# Patient Record
Sex: Male | Born: 1955 | Race: Black or African American | Hispanic: No | Marital: Single | Smoking: Former smoker
Health system: Southern US, Community
[De-identification: ages and names within clinical notes are randomized; demographics above are authoritative.]

## PROBLEM LIST (undated history)

## (undated) DIAGNOSIS — T7840XA Allergy, unspecified, initial encounter: Secondary | ICD-10-CM

## (undated) DIAGNOSIS — M1990 Unspecified osteoarthritis, unspecified site: Secondary | ICD-10-CM

## (undated) DIAGNOSIS — G5602 Carpal tunnel syndrome, left upper limb: Secondary | ICD-10-CM

## (undated) DIAGNOSIS — F1910 Other psychoactive substance abuse, uncomplicated: Secondary | ICD-10-CM

## (undated) HISTORY — DX: Allergy, unspecified, initial encounter: T78.40XA

## (undated) HISTORY — PX: DENTAL SURGERY: SHX609

## (undated) HISTORY — PX: REPAIR ANKLE LIGAMENT: SUR1187

## (undated) HISTORY — PX: CATARACT EXTRACTION, BILATERAL: SHX1313

---

## 2012-04-07 ENCOUNTER — Encounter (HOSPITAL_COMMUNITY): Payer: Self-pay | Admitting: *Deleted

## 2012-04-07 ENCOUNTER — Emergency Department (HOSPITAL_COMMUNITY)
Admission: EM | Admit: 2012-04-07 | Discharge: 2012-04-07 | Disposition: A | Discharge status: Home / Self Care | Payer: Medicare Other | Attending: Emergency Medicine | Admitting: Emergency Medicine

## 2012-04-07 DIAGNOSIS — F1910 Other psychoactive substance abuse, uncomplicated: Secondary | ICD-10-CM | POA: Insufficient documentation

## 2012-04-07 DIAGNOSIS — Z8739 Personal history of other diseases of the musculoskeletal system and connective tissue: Secondary | ICD-10-CM | POA: Insufficient documentation

## 2012-04-07 HISTORY — DX: Other psychoactive substance abuse, uncomplicated: F19.10

## 2012-04-07 HISTORY — DX: Carpal tunnel syndrome, left upper limb: G56.02

## 2012-04-07 HISTORY — DX: Unspecified osteoarthritis, unspecified site: M19.90

## 2012-04-07 LAB — CBC
HCT: 42.4 % (ref 39.0–52.0)
RBC: 4.34 MIL/uL (ref 4.22–5.81)
RDW: 12.8 % (ref 11.5–15.5)
WBC: 4.4 10*3/uL (ref 4.0–10.5)

## 2012-04-07 LAB — COMPREHENSIVE METABOLIC PANEL
AST: 22 U/L (ref 0–37)
Albumin: 3.8 g/dL (ref 3.5–5.2)
Alkaline Phosphatase: 63 U/L (ref 39–117)
BUN: 15 mg/dL (ref 6–23)
CO2: 24 mEq/L (ref 19–32)
Chloride: 104 mEq/L (ref 96–112)
GFR calc non Af Amer: >90 mL/min (ref >90)
Potassium: 4.3 mEq/L (ref 3.5–5.1)
Total Bilirubin: 0.3 mg/dL (ref 0.3–1.2)

## 2012-04-07 LAB — URINALYSIS, ROUTINE W REFLEX MICROSCOPIC
Glucose, UA: NEGATIVE mg/dL
Hgb urine dipstick: NEGATIVE
Ketones, ur: NEGATIVE mg/dL
Protein, ur: NEGATIVE mg/dL

## 2012-04-07 LAB — RAPID URINE DRUG SCREEN, HOSP PERFORMED
Amphetamines: NOT DETECTED
Barbiturates: NOT DETECTED
Benzodiazepines: NOT DETECTED
Tetrahydrocannabinol: NOT DETECTED

## 2012-04-07 NOTE — ED Provider Notes (Addendum)
History     CSN: [id number]  Arrival date & time 04/07/12  0903   First MD Initiated Contact with Patient 04/07/12 1104      Chief Complaint  Patient presents with  . Medical Clearance    crack cocaine detox    (Consider location/radiation/quality/duration/timing/severity/associated sxs/prior treatment) The history is provided by the patient.   the patient is a 56 year old, male, with a history of cocaine abuse, who presents to emergency department stating he wants to get off crack cocaine and also that he may kill somebody who he is around her.  He says that in his neighborhood.  He is getting a lot of pressure from other people to join them using drugs and drinking alcohol and other illegal behaviors.  He says that he feels as if he is not able to call his anger.  He will kill them.  He denies suicidal thoughts.  His last cocaine use was 3 days ago.  He rarely drinks alcohol.  He has no somatic complaints at this time and has not been ill recently  Past Medical History  Diagnosis Date  . Substance abuse   . Arthritis   . Carpal tunnel syndrome, left     History reviewed. No pertinent past surgical history.  No family history on file.  History  Substance Use Topics  . Smoking status: Current Everyday Smoker -- 1.0 packs/day    Types: Cigarettes  . Smokeless tobacco: Not on file  . Alcohol Use: Yes     occa      Review of Systems  Constitutional: Negative for fever and chills.  Respiratory: Negative for cough.   Gastrointestinal: Negative for nausea, vomiting and diarrhea.  Musculoskeletal: Negative for back pain.  Neurological: Negative for headaches.  Psychiatric/Behavioral: Negative for suicidal ideas and confusion.       Homicidal thoughts  All other systems reviewed and are negative.    Allergies  Review of patient's allergies indicates no known allergies.  Home Medications   Current Outpatient Rx  Name Route Sig Dispense Refill  . IBUPROFEN 200 MG PO  TABS Oral Take 200 mg by mouth every 6 (six) hours as needed. pain      BP 143/72  Pulse 50  Temp(Src) 97.6 F (36.4 C) (Oral)  Resp 12  SpO2 100%  Physical Exam  Vitals reviewed. Constitutional: He is oriented to person, place, and time. He appears well-developed and well-nourished.  HENT:  Head: Normocephalic and atraumatic.  Eyes: Conjunctivae are normal.  Neck: Normal range of motion.  Cardiovascular: Regular rhythm.        Bradycardia  Pulmonary/Chest: Effort normal. No respiratory distress. He has no rales.  Abdominal: Soft. He exhibits no distension. There is no tenderness.  Musculoskeletal: Normal range of motion.  Neurological: He is alert and oriented to person, place, and time.  Skin: Skin is warm and dry.  Psychiatric: He has a normal mood and affect. Thought content normal.    ED Course  Procedures (including critical care time) 56 year old, male, who abuses cocaine, presents emergency department requesting help to stop using cocaine, but also homicidal thoughts towards people in his community.  He is not suicidal.  No evidence of psychosis or acute impairment with drugs or alcohol.  We will perform a medical screening examination and consult the act for further evaluation.  Labs Reviewed  COMPREHENSIVE METABOLIC PANEL - Abnormal; Notable for the following:    Glucose, Bld 59 (*)    All other components within normal limits  URINE RAPID DRUG SCREEN (HOSP PERFORMED) - Abnormal; Notable for the following:    Cocaine POSITIVE (*)    All other components within normal limits  CBC  URINALYSIS, ROUTINE W REFLEX MICROSCOPIC  ETHANOL   No results found.   No diagnosis found.  12:39 PM Spoke with act. She will see pt in ed.  Pt was seen by act.  He did not mention si/hi.  They have arranged outpt tx which he understands and agrees with  MDM  Cocaine abuse with homicidal thoughts        Cheri Guppy, MD 04/07/12 1240  Cheri Guppy,  MD 04/07/12 [phone]

## 2012-04-07 NOTE — ED Notes (Signed)
Report received-no s/s's of disress-oriented to room/unit rules-voicing no complaints at this time

## 2012-04-07 NOTE — Discharge Instructions (Signed)
Follow up as directed by the ACT team for treatment of substance abuse

## 2012-04-07 NOTE — ED Notes (Signed)
Pt requesting detox from crack cocaine, last use was Friday.  Pt reports that "people are trying to hold on to me and i want to get away from them."  Pt reports that he needs help.  Pt denies SI at this time.

## 2012-04-07 NOTE — BH Assessment (Signed)
Assessment Note   Christian Elliott is an 56 y.o. male.  Patient presented to the Emergency Department requesting assistance with substance abuse. Patient reports he last used on Friday and not currently having any withdrawal systems. During assessment, patient's thoughts were tangential and unorganized.  Patient expressed interest and desire to enter a rehabilitation program.  Explained to patient that we can provide him with resources and he will be able to follow up with a facility on his own since the last time he used was on Friday morning. Even thought pt is said to have reported homicidal thoughts to EDP, he currently denies SI/HI/AVH.     Axis I: Substance Abuse Axis II: Deferred Axis III:  Past Medical History  Diagnosis Date  . Substance abuse   . Arthritis   . Carpal tunnel syndrome, left    Axis IV: economic problems and other psychosocial or environmental problems Axis V: 51-60 moderate symptoms  Past Medical History:  Past Medical History  Diagnosis Date  . Substance abuse   . Arthritis   . Carpal tunnel syndrome, left     History reviewed. No pertinent past surgical history.  Family History: No family history on file.  Social History:  reports that he has been smoking Cigarettes.  He has been smoking about 1 pack per day. He does not have any smokeless tobacco history on file. He reports that he drinks alcohol. He reports that he uses illicit drugs (Cocaine).  Additional Social History:    Allergies: No Known Allergies  Home Medications:  No current facility-administered medications on file as of 04/07/2012.   No current outpatient prescriptions on file as of 04/07/2012.    OB/GYN Status:  No LMP for male patient.  General Assessment Data Location of Assessment: [facility] ED Living Arrangements: Other (Comment) Can pt return to current living arrangement?: Yes Admission Status: Voluntary Transfer from: Acute Hospital Referral Source: Self/Family/Friend  Education  Status Is patient currently in school?: No  Risk to self Suicidal Ideation: No Suicidal Intent: No Is patient at risk for suicide?: No Suicidal Plan?: No Access to Means: No Previous Attempts/Gestures: No Intentional Self Injurious Behavior: None Family Suicide History: Unknown Recent stressful life event(s): Financial Problems Persecutory voices/beliefs?: No Depression: Yes Depression Symptoms: Feeling worthless/self pity;Loss of interest in usual pleasures Substance abuse history and/or treatment for substance abuse?: Yes Suicide prevention information given to non-admitted patients: Not applicable  Risk to Others Homicidal Ideation: No Thoughts of Harm to Others: No Current Homicidal Intent: No Current Homicidal Plan: No Access to Homicidal Means: No History of harm to others?: No Assessment of Violence: None Noted Does patient have access to weapons?: No Criminal Charges Pending?: No Does patient have a court date: No  Psychosis Hallucinations: None noted Delusions: Unspecified  Mental Status Report Appear/Hygiene: Disheveled Eye Contact: Fair Motor Activity: Restlessness Speech: Tangential;Slurred;Incoherent Level of Consciousness: Alert Mood: Fearful Affect: Appropriate to circumstance Anxiety Level: None Thought Processes: Tangential;Circumstantial Judgement: Unimpaired Orientation: Person;Place;Time;Situation Obsessive Compulsive Thoughts/Behaviors: None  Cognitive Functioning Concentration: Normal Memory: Recent Intact;Remote Intact IQ: Average Insight: Fair Impulse Control: Fair Appetite: Good Sleep: No Change Vegetative Symptoms: None  Prior Inpatient Therapy Prior Inpatient Therapy: Yes Prior Therapy Dates: Various times prior to 2005 Prior Therapy Facilty/Provider(s): Various treatment facilities Reason for Treatment: Substance Abuse  Prior Outpatient Therapy Prior Outpatient Therapy: No                     Additional  Information 1:1 In Past 12 Months?: No  CIRT Risk: No Elopement Risk: No Does patient have medical clearance?: Yes     Disposition:  Disposition Disposition of Patient:  (Pending)    CSW recommends discharge with patient to follow up.    On Site Evaluation by:   Reviewed with Physician:     Marlaine Hind ANN S 04/07/2012 2:29 PM

## 2012-04-07 NOTE — ED Notes (Signed)
Pt attempted to give urine spec but was unable

## 2013-09-10 ENCOUNTER — Encounter (HOSPITAL_COMMUNITY): Payer: Self-pay | Admitting: *Deleted

## 2013-09-10 ENCOUNTER — Emergency Department (HOSPITAL_COMMUNITY)
Admission: EM | Admit: 2013-09-10 | Discharge: 2013-09-10 | Disposition: A | Discharge status: Home / Self Care | Payer: Medicaid Other | Attending: Emergency Medicine | Admitting: Emergency Medicine

## 2013-09-10 DIAGNOSIS — M2550 Pain in unspecified joint: Secondary | ICD-10-CM | POA: Insufficient documentation

## 2013-09-10 DIAGNOSIS — G5600 Carpal tunnel syndrome, unspecified upper limb: Secondary | ICD-10-CM | POA: Insufficient documentation

## 2013-09-10 DIAGNOSIS — M129 Arthropathy, unspecified: Secondary | ICD-10-CM | POA: Insufficient documentation

## 2013-09-10 DIAGNOSIS — F1410 Cocaine abuse, uncomplicated: Secondary | ICD-10-CM | POA: Insufficient documentation

## 2013-09-10 DIAGNOSIS — F1010 Alcohol abuse, uncomplicated: Secondary | ICD-10-CM | POA: Insufficient documentation

## 2013-09-10 DIAGNOSIS — R209 Unspecified disturbances of skin sensation: Secondary | ICD-10-CM | POA: Insufficient documentation

## 2013-09-10 DIAGNOSIS — F17200 Nicotine dependence, unspecified, uncomplicated: Secondary | ICD-10-CM | POA: Insufficient documentation

## 2013-09-10 DIAGNOSIS — E785 Hyperlipidemia, unspecified: Secondary | ICD-10-CM | POA: Insufficient documentation

## 2013-09-10 NOTE — ED Notes (Signed)
Pt reports pain to joints for extended amount of time. Having pain to entire body and also reports having cholesterol checked in past and it was high, does not take meds for it. No acute distress noted at triage.

## 2013-09-10 NOTE — ED Notes (Signed)
Wants "check up" regarding joint pain and cholesterol. Pt is from [place] and does not have a PCP in [place].

## 2013-09-10 NOTE — ED Provider Notes (Signed)
CSN: [id number]     Arrival date & time 09/10/13  1102 History  This chart was scribed for non-physician practitioner, Coral Ceo, PA, working with Shon Baton, MD, by [facility] ED Scribe. This patient was seen in room TR06C/TR06C and the patient's care was started at 1:46 PM.  Chief Complaint  Patient presents with  . Joint Pain  . Hyperlipidemia    The history is provided by the patient. No language interpreter was used.    HPI Comments: Christian Elliott is a 57 y.o. Male with a history of arthritis and carpal tunnel syndrome who presents to the Emergency Department complaining of intermittent, unchanged generalized joint pain for the past 5 years. He reports pain, described as "soreness", in his entire body. He states that this pain is worst in his back, neck and bilateral knees, wrists and hands. He denies recent injuries or trauma. He states that his pain is worsened when he is cold or when he is stationary for long periods of time. He states he has taken Ibuprofen with some relief. He states that he used to take Naprosyn for this pain on a regular basis, but that he no longer takes this medication. He states he has received injections with relief of his pain in the past. He also reports occasional tingling in his bilateral feet. He states that he has been told in the past that his cholesterol is high, but he states that he does not take medications for this. He states that he has been advised to make dietary changes to regulate his cholesterol. He denies weakness, numbness, redness or swelling in his joints, abdominal pain, nausea, emesis, constipation, diarrhea, chest pain, SOB, headache, light-headedness or any other symptoms. Pt is a current every day smoker of 1 pack/day and an occasional alcohol user. He also has a history of cocaine use.   Past Medical History  Diagnosis Date  . Substance abuse   . Arthritis   . Carpal tunnel syndrome, left    History reviewed. No  pertinent past surgical history. History reviewed. No pertinent family history. History  Substance Use Topics  . Smoking status: Current Every Day Smoker -- 1.00 packs/day    Types: Cigarettes  . Smokeless tobacco: Not on file  . Alcohol Use: Yes     Comment: occa    Review of Systems  Respiratory: Negative for shortness of breath.   Cardiovascular: Negative for chest pain.  Gastrointestinal: Negative for nausea, vomiting, abdominal pain, diarrhea and constipation.  Musculoskeletal: Positive for arthralgias.  Neurological: Negative for weakness, light-headedness, numbness and headaches.  All other systems reviewed and are negative.   Allergies  Review of patient's allergies indicates no known allergies.  Home Medications  No current outpatient prescriptions on file.  Triage Vitals: BP 117/70  Pulse 74  Temp(Src) 97.4 F (36.3 C) (Oral)  Ht 5\' 11"  (1.803 m)  Wt 152 lb (68.947 kg)  BMI 21.21 kg/m2  SpO2 98%  Filed Vitals:   09/10/13 1111  BP: 117/70  Pulse: 74  Temp: 97.4 F (36.3 C)  TempSrc: Oral  Height: 5\' 11"  (1.803 m)  Weight: 152 lb (68.947 kg)  SpO2: 98%     Physical Exam  Nursing note and vitals reviewed. Constitutional: He is oriented to person, place, and time. He appears well-developed and well-nourished. No distress.  HENT:  Head: Normocephalic and atraumatic.  Right Ear: External ear normal.  Left Ear: External ear normal.  Nose: Nose normal.  Mouth/Throat: Oropharynx is clear and  moist.  Eyes: Conjunctivae and EOM are normal. Pupils are equal, round, and reactive to light. Right eye exhibits no discharge. Left eye exhibits no discharge.  Neck: Normal range of motion. Neck supple. No tracheal deviation present.  No cervical spinal or paraspinal tenderness throughout  Cardiovascular: Normal rate, regular rhythm, normal heart sounds and intact distal pulses.  Exam reveals no gallop and no friction rub.   No murmur heard. Radial and dorsalis  pedis pulses present and equal bilaterally  Pulmonary/Chest: Effort normal and breath sounds normal. No respiratory distress. He has no wheezes. He has no rales. He exhibits no tenderness.  Abdominal: Soft. He exhibits no distension. There is no tenderness.  Musculoskeletal: Normal range of motion. He exhibits no edema and no tenderness.  Strength 5/5 throughout the upper and lower extremities bilaterally.  No tenderness to palpation to the upper and lower extremities throughout.  No limitations with knee flexion and extension, ankle circumduction, elbow flexion and extension, and shoulder circumduction.  Patient able to ambulate without difficulty or ataxia.   Neurological: He is alert and oriented to person, place, and time.  Gross sensation intact in the upper and lower extremities  Skin: Skin is warm and dry. He is not diaphoretic. No erythema.  No evidence of erythema, edema, ecchymosis, lacerations, or wounds to the joints or skin throughout.    Psychiatric: He has a normal mood and affect. His behavior is normal.    ED Course  Procedures (including critical care time)  DIAGNOSTIC STUDIES: Oxygen Saturation is 98% on RA, normal by my interpretation.    COORDINATION OF CARE:  Labs Review Labs Reviewed - No data to display Imaging Review No results found.  MDM   1. Arthralgia    Lamichael Elliott is a 57 y.o. Male with a history of arthritis and carpal tunnel syndrome who presents to the Emergency Department complaining of intermittent, unchanged generalized joint pain for the past 5 years.   Rechecks  1:54 PM- Pt was very hesitant to take Narcotics, because he believed they caused him to have hallucinations. Pt was hesitant to take Naprosyn because of unspecified reactions he has had in the past. Pt agrees with plan to take NSAID's to manage his pain and was given dosing instructions.    Etiology of arthralgias is likely due to arthritis.  Patient states he has had similar  unchanged pain for the past 5 years.  He denies any trauma or injury.  There are no signs of infection on exam including edema, erythema, or fever.  He is neurovascularly intact.  He states he has had evaluation in the past for his arthritis but is unsure what is causing his pain.  He also was told that he has high cholesterol 6 months ago but did not follow-up on this with his PCP. Patient was given dietary instructions on managing cholesterol and instructed to follow-up with his PCP for further management.  Patient was not prescribed anything for outpatient management.  He expressed concern for reactions to medications in the past and it was agreed that he could continue taking OTC medications.  Patient was instructed to return to the ED if they experience any joint edema/erytheama, fever, SOB, chest pain, weakness, loss of bowel/bladder function, or other concerns.  Patient was in agreement with discharge and plan.     Final impressions: 1. Arthralgias    Luiz Iron PA-C    I personally performed the services described in this documentation, which was scribed in my presence.  The recorded information has been reviewed and is accurate.     Jillyn Ledger, PA-C 09/12/13 1600

## 2013-09-13 NOTE — ED Provider Notes (Signed)
Medical screening examination/treatment/procedure(s) were performed by non-physician practitioner and as supervising physician I was immediately available for consultation/collaboration.  Shon Baton, MD 09/13/13 2030

## 2014-06-15 ENCOUNTER — Emergency Department (HOSPITAL_COMMUNITY)
Admission: EM | Admit: 2014-06-15 | Discharge: 2014-06-15 | Disposition: A | Discharge status: Home / Self Care | Payer: Medicare HMO | Attending: Emergency Medicine | Admitting: Emergency Medicine

## 2014-06-15 ENCOUNTER — Encounter (HOSPITAL_COMMUNITY): Payer: Self-pay | Admitting: Emergency Medicine

## 2014-06-15 DIAGNOSIS — F17200 Nicotine dependence, unspecified, uncomplicated: Secondary | ICD-10-CM | POA: Insufficient documentation

## 2014-06-15 DIAGNOSIS — Z8669 Personal history of other diseases of the nervous system and sense organs: Secondary | ICD-10-CM | POA: Diagnosis not present

## 2014-06-15 DIAGNOSIS — L988 Other specified disorders of the skin and subcutaneous tissue: Secondary | ICD-10-CM | POA: Diagnosis present

## 2014-06-15 DIAGNOSIS — Z8739 Personal history of other diseases of the musculoskeletal system and connective tissue: Secondary | ICD-10-CM | POA: Insufficient documentation

## 2014-06-15 DIAGNOSIS — Z79899 Other long term (current) drug therapy: Secondary | ICD-10-CM | POA: Insufficient documentation

## 2014-06-15 DIAGNOSIS — L089 Local infection of the skin and subcutaneous tissue, unspecified: Secondary | ICD-10-CM | POA: Diagnosis not present

## 2014-06-15 MED ORDER — CEPHALEXIN 500 MG PO CAPS: 500.0000 mg | ORAL_CAPSULE | Freq: Four times a day (QID) | ORAL | Status: DC

## 2014-06-15 NOTE — Discharge Instructions (Signed)
Take Keflex as directed until gone. Refer to attached documents for more information. Keep wound area clean.

## 2014-06-15 NOTE — ED Notes (Signed)
Hes had an itchy painful bump to L upper arm x 3 days

## 2014-06-15 NOTE — ED Provider Notes (Signed)
Medical screening examination/treatment/procedure(s) were performed by non-physician practitioner and as supervising physician I was immediately available for consultation/collaboration.   EKG Interpretation None       Martha K Linker, MD 06/15/14 1502 

## 2014-06-15 NOTE — ED Provider Notes (Signed)
CSN: [id number]     Arrival date & time 06/15/14  1251 History  This chart was scribed for non-physician practitioner, Alvina Chou, PA-C, working with Threasa Beards, MD, by Delphia Grates, ED Scribe. This patient was seen in room TR06C/TR06C and the patient's care was started at 1:59 PM.     Chief Complaint  Patient presents with  . Skin Problem     The history is provided by the patient. No language interpreter was used.    HPI Comments: Christian Elliott is a 58 y.o. male who presents to the Emergency Department complaining of a bump on upper left arm that appeared 3 days ago. Patient states he is unsure if he was bitten by an insect. There is associated itching. Patient has used Gold Bond lotion with no improvement.  He denies any pain. Patient has history of substance abuse and is a current 1 PPD smoker.  Past Medical History  Diagnosis Date  . Substance abuse   . Arthritis   . Carpal tunnel syndrome, left    History reviewed. No pertinent past surgical history. History reviewed. No pertinent family history. History  Substance Use Topics  . Smoking status: Current Every Day Smoker -- 1.00 packs/day    Types: Cigarettes  . Smokeless tobacco: Not on file  . Alcohol Use: No     Comment: occa    Review of Systems  Skin:       Bump on left upper arm  All other systems reviewed and are negative.     Allergies  Review of patient's allergies indicates no known allergies.  Home Medications   Prior to Admission medications   Medication Sig Start Date End Date Taking? Authorizing Provider  carboxymethylcellulose (REFRESH PLUS) 0.5 % SOLN Place 1 drop into both eyes 3 (three) times daily as needed (dry eyes).   Yes Historical Provider, MD  pravastatin (PRAVACHOL) 20 MG tablet Take 20 mg by mouth daily.   Yes Historical Provider, MD   Triage Vitals: BP 111/80  Pulse 83  Temp(Src) 97.9 F (36.6 C) (Oral)  Resp 20  SpO2 98%  Physical Exam  Nursing note and  vitals reviewed. Constitutional: He is oriented to person, place, and time. He appears well-developed and well-nourished. No distress.  HENT:  Head: Normocephalic and atraumatic.  Eyes: Conjunctivae and EOM are normal.  Neck: Neck supple. No tracheal deviation present.  Cardiovascular: Normal rate.   Pulmonary/Chest: Effort normal. No respiratory distress.  Musculoskeletal: Normal range of motion.  Neurological: He is alert and oriented to person, place, and time.  Skin: Skin is warm and dry.  2x2cm area of induration over the left bicep skin. There is a small puncture wound noted over the area of induration. No tenderness to palpation. No drainage noted.   Psychiatric: He has a normal mood and affect. His behavior is normal.    ED Course  Procedures (including critical care time)  DIAGNOSTIC STUDIES: Oxygen Saturation is 98% on room air, normal by my interpretation.    COORDINATION OF CARE: At 8657 Discussed treatment plan with patient which includes Keflex. Patient agrees.   Labs Review Labs Reviewed - No data to display  Imaging Review No results found.   EKG Interpretation None      MDM   Final diagnoses:  Skin infection    2:07 PM Patient likely has a skin infection and will be treated with keflex. Vitals stable and patient afebrile. Patient instructed to return with worsening or concerning symptoms.  I personally performed the services described in this documentation, which was scribed in my presence. The recorded information has been reviewed and is accurate.    Alvina Chou, PA-C 06/15/14 1407

## 2014-08-14 ENCOUNTER — Encounter (HOSPITAL_COMMUNITY): Payer: Self-pay | Admitting: Emergency Medicine

## 2014-08-14 ENCOUNTER — Emergency Department (HOSPITAL_COMMUNITY)
Admission: EM | Admit: 2014-08-14 | Discharge: 2014-08-14 | Disposition: A | Discharge status: Home / Self Care | Payer: Medicare HMO | Attending: Emergency Medicine | Admitting: Emergency Medicine

## 2014-08-14 DIAGNOSIS — Z79899 Other long term (current) drug therapy: Secondary | ICD-10-CM | POA: Insufficient documentation

## 2014-08-14 DIAGNOSIS — Z8739 Personal history of other diseases of the musculoskeletal system and connective tissue: Secondary | ICD-10-CM | POA: Diagnosis not present

## 2014-08-14 DIAGNOSIS — IMO0002 Reserved for concepts with insufficient information to code with codable children: Secondary | ICD-10-CM | POA: Diagnosis not present

## 2014-08-14 DIAGNOSIS — Z8669 Personal history of other diseases of the nervous system and sense organs: Secondary | ICD-10-CM | POA: Insufficient documentation

## 2014-08-14 DIAGNOSIS — F17200 Nicotine dependence, unspecified, uncomplicated: Secondary | ICD-10-CM | POA: Insufficient documentation

## 2014-08-14 DIAGNOSIS — R110 Nausea: Secondary | ICD-10-CM | POA: Diagnosis not present

## 2014-08-14 DIAGNOSIS — L02419 Cutaneous abscess of limb, unspecified: Secondary | ICD-10-CM

## 2014-08-14 MED ORDER — HYDROCODONE-ACETAMINOPHEN 5-325 MG PO TABS
2.0000 | ORAL_TABLET | ORAL | Status: DC | PRN
Start: 2014-08-14 — End: 2016-03-11

## 2014-08-14 MED ORDER — SULFAMETHOXAZOLE-TRIMETHOPRIM 800-160 MG PO TABS: 1.0000 | ORAL_TABLET | Freq: Two times a day (BID) | ORAL | Status: DC

## 2014-08-14 NOTE — Discharge Instructions (Signed)
Warm compresses 20 minutes 4 times a day.   You may use topical benadryl cream for itching Abscess An abscess is an infected area that contains a collection of pus and debris.It can occur in almost any part of the body. An abscess is also known as a furuncle or boil. CAUSES  An abscess occurs when tissue gets infected. This can occur from blockage of oil or sweat glands, infection of hair follicles, or a minor injury to the skin. As the body tries to fight the infection, pus collects in the area and creates pressure under the skin. This pressure causes pain. People with weakened immune systems have difficulty fighting infections and get certain abscesses more often.  SYMPTOMS Usually an abscess develops on the skin and becomes a painful mass that is red, warm, and tender. If the abscess forms under the skin, you may feel a moveable soft area under the skin. Some abscesses break open (rupture) on their own, but most will continue to get worse without care. The infection can spread deeper into the body and eventually into the bloodstream, causing you to feel ill.  DIAGNOSIS  Your caregiver will take your medical history and perform a physical exam. A sample of fluid may also be taken from the abscess to determine what is causing your infection. TREATMENT  Your caregiver may prescribe antibiotic medicines to fight the infection. However, taking antibiotics alone usually does not cure an abscess. Your caregiver may need to make a small cut (incision) in the abscess to drain the pus. In some cases, gauze is packed into the abscess to reduce pain and to continue draining the area. HOME CARE INSTRUCTIONS   Only take over-the-counter or prescription medicines for pain, discomfort, or fever as directed by your caregiver.  If you were prescribed antibiotics, take them as directed. Finish them even if you start to feel better.  If gauze is used, follow your caregiver's directions for changing the gauze.  To  avoid spreading the infection:  Keep your draining abscess covered with a bandage.  Wash your hands well.  Do not share personal care items, towels, or whirlpools with others.  Avoid skin contact with others.  Keep your skin and clothes clean around the abscess.  Keep all follow-up appointments as directed by your caregiver. SEEK MEDICAL CARE IF:   You have increased pain, swelling, redness, fluid drainage, or bleeding.  You have muscle aches, chills, or a general ill feeling.  You have a fever. MAKE SURE YOU:   Understand these instructions.  Will watch your condition.  Will get help right away if you are not doing well or get worse. Document Released: 09/19/2005 Document Revised: 06/10/2012 Document Reviewed: 02/22/2012 [facility] [facility] [employer] Patient Information 2015 [place], [place]. This information is not intended to replace advice given to you by your health care provider. Make sure you discuss any questions you have with your health care provider.

## 2014-08-14 NOTE — ED Provider Notes (Signed)
CSN: [id number]     Arrival date & time 08/14/14  1127 History   First MD Initiated Contact with Patient 08/14/14 1200     Chief Complaint  Patient presents with  . Abscess     (Consider location/radiation/quality/duration/timing/severity/associated sxs/prior Treatment) Patient is a 58 y.o. male presenting with abscess. The history is provided by the patient. No language interpreter was used.  Abscess Location:  Shoulder/arm Shoulder/arm abscess location:  R axilla Size:  1 Abscess quality: draining, itching and redness   Red streaking: no   Progression:  Worsening Chronicity:  New Relieved by:  Nothing Worsened by:  Nothing tried Ineffective treatments:  None tried Associated symptoms: nausea     Past Medical History  Diagnosis Date  . Substance abuse   . Arthritis   . Carpal tunnel syndrome, left    No past surgical history on file. No family history on file. History  Substance Use Topics  . Smoking status: Current Every Day Smoker -- 1.00 packs/day    Types: Cigarettes  . Smokeless tobacco: Not on file  . Alcohol Use: No     Comment: occa    Review of Systems  Gastrointestinal: Positive for nausea.  All other systems reviewed and are negative.     Allergies  Review of patient's allergies indicates no known allergies.  Home Medications   Prior to Admission medications   Medication Sig Start Date End Date Taking? Authorizing Provider  pravastatin (PRAVACHOL) 20 MG tablet Take 20 mg by mouth daily.   Yes Historical Provider, MD   BP 117/79  Pulse 77  Temp(Src) 98 F (36.7 C) (Oral)  Resp 16  SpO2 100% Physical Exam  Nursing note and vitals reviewed. Constitutional: He appears well-developed and well-nourished.  HENT:  Head: Normocephalic.  Eyes: Pupils are equal, round, and reactive to light.  Neck: Normal range of motion.  Cardiovascular: Normal rate.   Pulmonary/Chest: Effort normal.  Abdominal: Soft.  Musculoskeletal: He exhibits  tenderness.  1cm swollen area under right arm,  No erythema  Neurological: He is alert.  Skin: Skin is warm.  Psychiatric: He has a normal mood and affect.    ED Course  Procedures (including critical care time) Labs Review Labs Reviewed - No data to display  Imaging Review No results found.   EKG Interpretation None      MDM   Final diagnoses:  Abscess, axilla    Bactrim Hydrocodone Benadryl Return if any problems.    [place], PA-C 08/14/14 [place], [place] 08/14/14 1245

## 2014-08-14 NOTE — ED Notes (Signed)
Pt c/o abscess to under rt arm x 3 days.

## 2014-08-15 NOTE — ED Provider Notes (Signed)
Medical screening examination/treatment/procedure(s) were performed by non-physician practitioner and as supervising physician I was immediately available for consultation/collaboration.   EKG Interpretation None        Pamella Pert, MD 08/15/14 [phone]

## 2014-11-06 ENCOUNTER — Encounter (HOSPITAL_COMMUNITY): Payer: Self-pay | Admitting: Emergency Medicine

## 2014-11-06 ENCOUNTER — Emergency Department (HOSPITAL_COMMUNITY)
Admission: EM | Admit: 2014-11-06 | Discharge: 2014-11-06 | Disposition: A | Discharge status: Home / Self Care | Payer: Medicare HMO | Attending: Emergency Medicine | Admitting: Emergency Medicine

## 2014-11-06 DIAGNOSIS — M79662 Pain in left lower leg: Secondary | ICD-10-CM | POA: Diagnosis not present

## 2014-11-06 DIAGNOSIS — B353 Tinea pedis: Secondary | ICD-10-CM

## 2014-11-06 DIAGNOSIS — Z79899 Other long term (current) drug therapy: Secondary | ICD-10-CM | POA: Insufficient documentation

## 2014-11-06 DIAGNOSIS — Z720 Tobacco use: Secondary | ICD-10-CM | POA: Diagnosis not present

## 2014-11-06 DIAGNOSIS — M1990 Unspecified osteoarthritis, unspecified site: Secondary | ICD-10-CM | POA: Diagnosis not present

## 2014-11-06 DIAGNOSIS — L02415 Cutaneous abscess of right lower limb: Secondary | ICD-10-CM | POA: Diagnosis present

## 2014-11-06 DIAGNOSIS — M79605 Pain in left leg: Secondary | ICD-10-CM

## 2014-11-06 MED ORDER — CLOTRIMAZOLE 1 % EX CREA: 1.0000 "application " | TOPICAL_CREAM | Freq: Two times a day (BID) | CUTANEOUS | Status: DC

## 2014-11-06 MED ORDER — DIPHENHYDRAMINE HCL 25 MG PO TABS: 25.0000 mg | ORAL_TABLET | Freq: Four times a day (QID) | ORAL | Status: DC | PRN

## 2014-11-06 MED ORDER — SULFAMETHOXAZOLE-TRIMETHOPRIM 800-160 MG PO TABS: 1.0000 | ORAL_TABLET | Freq: Two times a day (BID) | ORAL | Status: DC

## 2014-11-06 NOTE — ED Provider Notes (Signed)
CSN: [id number]     Arrival date & time 11/06/14  1534 History  This chart was scribed for Cherylann Parr, PA-C, working with Artis Delay, MD found by Starleen Arms, ED Scribe. This patient was seen in room WTR6/WTR6 and the patient's care was started at 5:00 PM.   Chief Complaint  Patient presents with  . Abscess    pustule on r/lower leg  . Insect Bite    sore, itching red patch on both leower legs  . Cellulitis    lower legs   The history is provided by the patient. No language interpreter was used.   HPI Comments: Christian Elliott is a 58 y.o. male who presents to the Emergency Department complaining of 2 gradually worsening areas of redness, swelling, drainage, and warmth on his left shin below the knee and mid-right shin that he first noticed this morning.  He reports that the affected area on the left shin is itchy.  Patient reports that these may be insect bites but he is unsure.    Past Medical History  Diagnosis Date  . Substance abuse   . Arthritis   . Carpal tunnel syndrome, left    Past Surgical History  Procedure Laterality Date  . Dental surgery     Family History  Problem Relation Age of Onset  . Diabetes Sister   . Diabetes Other    History  Substance Use Topics  . Smoking status: Current Every Day Smoker -- 1.00 packs/day    Types: Cigarettes  . Smokeless tobacco: Not on file  . Alcohol Use: Yes     Comment: occa    Review of Systems  Constitutional: Negative for fever.  Skin: Positive for color change and wound.  All other systems reviewed and are negative.     Allergies  Review of patient's allergies indicates no known allergies.  Home Medications   Prior to Admission medications   Medication Sig Start Date End Date Taking? Authorizing Provider  clotrimazole (LOTRIMIN) 1 % cream Apply 1 application topically 2 (two) times daily. 11/06/14   Matison Nuccio A Forcucci, PA-C  diphenhydrAMINE (BENADRYL) 25 MG tablet Take 1 tablet (25 mg total) by  mouth every 6 (six) hours as needed for itching. 11/06/14   Aloria Looper A Forcucci, PA-C  HYDROcodone-acetaminophen (NORCO/VICODIN) 5-325 MG per tablet Take 2 tablets by mouth every 4 (four) hours as needed. 08/14/14   Fransico Meadow, PA-C  pravastatin (PRAVACHOL) 20 MG tablet Take 20 mg by mouth daily.    Historical Provider, MD  sulfamethoxazole-trimethoprim (SEPTRA DS) 800-160 MG per tablet Take 1 tablet by mouth 2 (two) times daily. 11/06/14   [place] Kirchner A Forcucci, PA-C   BP 105/75 mmHg  Pulse 89  Temp(Src) 98.1 F (36.7 C) (Oral)  Resp 18  Wt 159 lb (72.122 kg)  SpO2 100% Physical Exam  Constitutional: He is oriented to person, place, and time. He appears well-developed and well-nourished. No distress.  HENT:  Head: Normocephalic and atraumatic.  Eyes: Conjunctivae and EOM are normal.  Neck: Neck supple. No tracheal deviation present.  Cardiovascular: Normal rate.   Pulmonary/Chest: Effort normal. No respiratory distress.  Musculoskeletal: Normal range of motion.  Neurological: He is alert and oriented to person, place, and time.  Skin: Skin is warm and dry.  Left leg has 2 cm x 1 cm erythematous warm nodule to the left anterior tibia below the anterior tibial tuberosity.   Right leg has actively draining superficial abscess that is 1 cm x 1 cm  Bilateral peeling of skin and moisture located between toes on the right and left foot with mild erythema.    Psychiatric: He has a normal mood and affect. His behavior is normal.  Nursing note and vitals reviewed.   ED Course  Procedures (including critical care time)  DIAGNOSTIC STUDIES: Oxygen Saturation is 100% on RA, normal by my interpretation.    COORDINATION OF CARE:  5:10 PM Will prescribe bactrim and benadryl for itching.  Will prescribe antibiotic.  Advised patient to use warm compress on area.  Advised patient of return precautions including red streaking, nausea/vomiting, and increasing size of the area of  complaint.  Labs Review Labs Reviewed - No data to display  Imaging Review No results found.   EKG Interpretation None      MDM   Final diagnoses:  Abscess of right leg  Left leg pain  Tinea pedis of both feet   Patient is a 58 y.o. Male with multiple skin complaints.  Physical exam reveals an actively draining superficial abscess on the right leg, an erythematous warm nodule on the left leg, and athletes foot.  Left leg is early abscess vs. Cellulitis vs. Localized insect bite reaction.  Will cover with oral benadryl, bactrim, and will give lotromin for athletes foot.  Patient to return for worsening signs of infection.  Patient to use warm compresses.  Patient states understanding and agreement.   I personally performed the services described in this documentation, which was scribed in my presence. The recorded information has been reviewed and is accurate.   Cherylann Parr, PA-C 11/06/14 1720  Artis Delay, MD 11/06/14 [phone]

## 2014-11-06 NOTE — ED Notes (Signed)
Pt reports that he noticed the itching, red patches on front of both lower legs. Small raised pustule on r/lower leg. Treated this am with Benadryl cream, no change in discormfort

## 2014-11-06 NOTE — Discharge Instructions (Signed)
Abscess An abscess is an infected area that contains a collection of pus and debris.It can occur in almost any part of the body. An abscess is also known as a furuncle or boil. CAUSES  An abscess occurs when tissue gets infected. This can occur from blockage of oil or sweat glands, infection of hair follicles, or a minor injury to the skin. As the body tries to fight the infection, pus collects in the area and creates pressure under the skin. This pressure causes pain. People with weakened immune systems have difficulty fighting infections and get certain abscesses more often.  SYMPTOMS Usually an abscess develops on the skin and becomes a painful mass that is red, warm, and tender. If the abscess forms under the skin, you may feel a moveable soft area under the skin. Some abscesses break open (rupture) on their own, but most will continue to get worse without care. The infection can spread deeper into the body and eventually into the bloodstream, causing you to feel ill.  DIAGNOSIS  Your caregiver will take your medical history and perform a physical exam. A sample of fluid may also be taken from the abscess to determine what is causing your infection. TREATMENT  Your caregiver may prescribe antibiotic medicines to fight the infection. However, taking antibiotics alone usually does not cure an abscess. Your caregiver may need to make a small cut (incision) in the abscess to drain the pus. In some cases, gauze is packed into the abscess to reduce pain and to continue draining the area. HOME CARE INSTRUCTIONS   Only take over-the-counter or prescription medicines for pain, discomfort, or fever as directed by your caregiver.  If you were prescribed antibiotics, take them as directed. Finish them even if you start to feel better.  If gauze is used, follow your caregiver's directions for changing the gauze.  To avoid spreading the infection:  Keep your draining abscess covered with a  bandage.  Wash your hands well.  Do not share personal care items, towels, or whirlpools with others.  Avoid skin contact with others.  Keep your skin and clothes clean around the abscess.  Keep all follow-up appointments as directed by your caregiver. SEEK MEDICAL CARE IF:   You have increased pain, swelling, redness, fluid drainage, or bleeding.  You have muscle aches, chills, or a general ill feeling.  You have a fever. MAKE SURE YOU:   Understand these instructions.  Will watch your condition.  Will get help right away if you are not doing well or get worse. Document Released: 09/19/2005 Document Revised: 06/10/2012 Document Reviewed: 02/22/2012 [facility] Patient Information 2015 [place], [place]. This information is not intended to replace advice given to you by your health care provider. Make sure you discuss any questions you have with your health care provider.  Athlete's Foot Athlete's foot (tinea pedis) is a fungal infection of the skin on the feet. It often occurs on the skin between the toes or underneath the toes. It can also occur on the soles of the feet. Athlete's foot is more likely to occur in hot, humid weather. Not washing your feet or changing your socks often enough can contribute to athlete's foot. The infection can spread from person to person (contagious). CAUSES Athlete's foot is caused by a fungus. This fungus thrives in warm, moist places. Most people get athlete's foot by sharing shower stalls, towels, and wet floors with an infected person. People with weakened immune systems, including those with diabetes, may be more likely  to get athlete's foot. SYMPTOMS   Itchy areas between the toes or on the soles of the feet.  White, flaky, or scaly areas between the toes or on the soles of the feet.  Tiny, intensely itchy blisters between the toes or on the soles of the feet.  Tiny cuts on the skin. These cuts can develop a bacterial infection.  Thick or  discolored toenails. DIAGNOSIS  Your caregiver can usually tell what the problem is by doing a physical exam. Your caregiver may also take a skin sample from the rash area. The skin sample may be examined under a microscope, or it may be tested to see if fungus will grow in the sample. A sample may also be taken from your toenail for testing. TREATMENT  Over-the-counter and prescription medicines can be used to kill the fungus. These medicines are available as powders or creams. Your caregiver can suggest medicines for you. Fungal infections respond slowly to treatment. You may need to continue using your medicine for several weeks. PREVENTION   Do not share towels.  Wear sandals in wet areas, such as shared locker rooms and shared showers.  Keep your feet dry. Wear shoes that allow air to circulate. Wear cotton or wool socks. HOME CARE INSTRUCTIONS   Take medicines as directed by your caregiver. Do not use steroid creams on athlete's foot.  Keep your feet clean and cool. Wash your feet daily and dry them thoroughly, especially between your toes.  Change your socks every day. Wear cotton or wool socks. In hot climates, you may need to change your socks 2 to 3 times per day.  Wear sandals or canvas tennis shoes with good air circulation.  If you have blisters, soak your feet in Burow's solution or Epsom salts for 20 to 30 minutes, 2 times a day to dry out the blisters. Make sure you dry your feet thoroughly afterward. SEEK MEDICAL CARE IF:   You have a fever.  You have swelling, soreness, warmth, or redness in your foot.  You are not getting better after 7 days of treatment.  You are not completely cured after 30 days.  You have any problems caused by your medicines. MAKE SURE YOU:   Understand these instructions.  Will watch your condition.  Will get help right away if you are not doing well or get worse. Document Released: 12/07/2000 Document Revised: 03/03/2012 Document  Reviewed: 09/28/2011 [facility] Patient Information 2015 [place], [place]. This information is not intended to replace advice given to you by your health care provider. Make sure you discuss any questions you have with your health care provider.

## 2014-11-09 ENCOUNTER — Emergency Department (HOSPITAL_COMMUNITY)
Admission: EM | Admit: 2014-11-09 | Discharge: 2014-11-09 | Disposition: A | Discharge status: Home / Self Care | Payer: Medicare HMO | Attending: Emergency Medicine | Admitting: Emergency Medicine

## 2014-11-09 ENCOUNTER — Encounter (HOSPITAL_COMMUNITY): Payer: Self-pay

## 2014-11-09 DIAGNOSIS — L02416 Cutaneous abscess of left lower limb: Secondary | ICD-10-CM | POA: Diagnosis not present

## 2014-11-09 DIAGNOSIS — L02415 Cutaneous abscess of right lower limb: Secondary | ICD-10-CM | POA: Diagnosis not present

## 2014-11-09 DIAGNOSIS — Z720 Tobacco use: Secondary | ICD-10-CM | POA: Insufficient documentation

## 2014-11-09 DIAGNOSIS — Z79899 Other long term (current) drug therapy: Secondary | ICD-10-CM | POA: Diagnosis not present

## 2014-11-09 DIAGNOSIS — M1990 Unspecified osteoarthritis, unspecified site: Secondary | ICD-10-CM | POA: Diagnosis not present

## 2014-11-09 DIAGNOSIS — Z792 Long term (current) use of antibiotics: Secondary | ICD-10-CM | POA: Diagnosis not present

## 2014-11-09 DIAGNOSIS — Z8669 Personal history of other diseases of the nervous system and sense organs: Secondary | ICD-10-CM | POA: Diagnosis not present

## 2014-11-09 MED ORDER — LIDOCAINE-EPINEPHRINE 2 %-1:100000 IJ SOLN
20.0000 mL | Freq: Once | INTRAMUSCULAR | Status: AC
  Administered 2014-11-09: 20 mL

## 2014-11-09 MED ORDER — CLINDAMYCIN HCL 300 MG PO CAPS: 300.0000 mg | ORAL_CAPSULE | Freq: Four times a day (QID) | ORAL | Status: DC

## 2014-11-09 NOTE — Discharge Instructions (Signed)
STOP taking bactrim and START taking clindamycin. Follow up with your primary care doctor. Apply warm soaks throughout the day.  Abscess An abscess is an infected area that contains a collection of pus and debris.It can occur in almost any part of the body. An abscess is also known as a furuncle or boil. CAUSES  An abscess occurs when tissue gets infected. This can occur from blockage of oil or sweat glands, infection of hair follicles, or a minor injury to the skin. As the body tries to fight the infection, pus collects in the area and creates pressure under the skin. This pressure causes pain. People with weakened immune systems have difficulty fighting infections and get certain abscesses more often.  SYMPTOMS Usually an abscess develops on the skin and becomes a painful mass that is red, warm, and tender. If the abscess forms under the skin, you may feel a moveable soft area under the skin. Some abscesses break open (rupture) on their own, but most will continue to get worse without care. The infection can spread deeper into the body and eventually into the bloodstream, causing you to feel ill.  DIAGNOSIS  Your caregiver will take your medical history and perform a physical exam. A sample of fluid may also be taken from the abscess to determine what is causing your infection. TREATMENT  Your caregiver may prescribe antibiotic medicines to fight the infection. However, taking antibiotics alone usually does not cure an abscess. Your caregiver may need to make a small cut (incision) in the abscess to drain the pus. In some cases, gauze is packed into the abscess to reduce pain and to continue draining the area. HOME CARE INSTRUCTIONS   Only take over-the-counter or prescription medicines for pain, discomfort, or fever as directed by your caregiver.  If you were prescribed antibiotics, take them as directed. Finish them even if you start to feel better.  If gauze is used, follow your caregiver's  directions for changing the gauze.  To avoid spreading the infection:  Keep your draining abscess covered with a bandage.  Wash your hands well.  Do not share personal care items, towels, or whirlpools with others.  Avoid skin contact with others.  Keep your skin and clothes clean around the abscess.  Keep all follow-up appointments as directed by your caregiver. SEEK MEDICAL CARE IF:   You have increased pain, swelling, redness, fluid drainage, or bleeding.  You have muscle aches, chills, or a general ill feeling.  You have a fever. MAKE SURE YOU:   Understand these instructions.  Will watch your condition.  Will get help right away if you are not doing well or get worse. Document Released: 09/19/2005 Document Revised: 06/10/2012 Document Reviewed: 02/22/2012 [facility] Patient Information 2015 [place], [place]. This information is not intended to replace advice given to you by your health care provider. Make sure you discuss any questions you have with your health care provider.  Cellulitis Cellulitis is an infection of the skin and the tissue beneath it. The infected area is usually red and tender. Cellulitis occurs most often in the arms and lower legs.  CAUSES  Cellulitis is caused by bacteria that enter the skin through cracks or cuts in the skin. The most common types of bacteria that cause cellulitis are staphylococci and streptococci. SIGNS AND SYMPTOMS   Redness and warmth.  Swelling.  Tenderness or pain.  Fever. DIAGNOSIS  Your health care provider can usually determine what is wrong based on a physical exam. Blood tests  may also be done. TREATMENT  Treatment usually involves taking an antibiotic medicine. HOME CARE INSTRUCTIONS   Take your antibiotic medicine as directed by your health care provider. Finish the antibiotic even if you start to feel better.  Keep the infected arm or leg elevated to reduce swelling.  Apply a warm cloth to the affected area  up to 4 times per day to relieve pain.  Take medicines only as directed by your health care provider.  Keep all follow-up visits as directed by your health care provider. SEEK MEDICAL CARE IF:   You notice red streaks coming from the infected area.  Your red area gets larger or turns dark in color.  Your bone or joint underneath the infected area becomes painful after the skin has healed.  Your infection returns in the same area or another area.  You notice a swollen bump in the infected area.  You develop new symptoms.  You have a fever. SEEK IMMEDIATE MEDICAL CARE IF:   You feel very sleepy.  You develop vomiting or diarrhea.  You have a general ill feeling (malaise) with muscle aches and pains. MAKE SURE YOU:   Understand these instructions.  Will watch your condition.  Will get help right away if you are not doing well or get worse. Document Released: 09/19/2005 Document Revised: 04/26/2014 Document Reviewed: 02/25/2012 [facility] Patient Information 2015 [place], [place]. This information is not intended to replace advice given to you by your health care provider. Make sure you discuss any questions you have with your health care provider.

## 2014-11-09 NOTE — ED Provider Notes (Signed)
CSN: [id number]     Arrival date & time 11/09/14  1302 History  This chart was scribed for non-physician practitioner, Lucien Mons, PA-C working with Veryl Speak, MD by Evelene Croon, ED Scribe. This patient was seen in room WTR7/WTR7 and the patient's care was started at 2:42 PM.    Chief Complaint  Patient presents with  . Abscess     The history is provided by the patient. No language interpreter was used.     HPI Comments:  Christian Elliott is a 58 y.o. male who presents to the Emergency Department complaining of a lesion to his LLE and smaller lesion on his RLE. He was evaluated three days ago for the same and given benadryl, bactrim and clotrimazol which he has been using with no improvement. He reports itching around lesions and increased redness to the site.  He has also tried hot compresses without relief. He denies fever and drainage.   Past Medical History  Diagnosis Date  . Substance abuse   . Arthritis   . Carpal tunnel syndrome, left    Past Surgical History  Procedure Laterality Date  . Dental surgery     Family History  Problem Relation Age of Onset  . Diabetes Sister   . Diabetes Other    History  Substance Use Topics  . Smoking status: Current Every Day Smoker -- 1.00 packs/day    Types: Cigarettes  . Smokeless tobacco: Not on file  . Alcohol Use: Yes     Comment: occa    Review of Systems  Skin: Positive for wound.  All other systems reviewed and are negative.     Allergies  Review of patient's allergies indicates no known allergies.  Home Medications   Prior to Admission medications   Medication Sig Start Date End Date Taking? Authorizing Provider  clindamycin (CLEOCIN) 300 MG capsule Take 1 capsule (300 mg total) by mouth 4 (four) times daily. X 7 days 11/09/14   Carman Ching, PA-C  clotrimazole (LOTRIMIN) 1 % cream Apply 1 application topically 2 (two) times daily. 11/06/14   Courtney A Forcucci, PA-C  diphenhydrAMINE (BENADRYL) 25 MG  tablet Take 1 tablet (25 mg total) by mouth every 6 (six) hours as needed for itching. 11/06/14   Courtney A Forcucci, PA-C  HYDROcodone-acetaminophen (NORCO/VICODIN) 5-325 MG per tablet Take 2 tablets by mouth every 4 (four) hours as needed. 08/14/14   Fransico Meadow, PA-C  pravastatin (PRAVACHOL) 20 MG tablet Take 20 mg by mouth daily.    Historical Provider, MD  sulfamethoxazole-trimethoprim (SEPTRA DS) 800-160 MG per tablet Take 1 tablet by mouth 2 (two) times daily. 11/06/14   Courtney A Forcucci, PA-C   BP 119/73 mmHg  Pulse 78  Temp(Src) 98.5 F (36.9 C) (Oral)  Resp 16  SpO2 98% Physical Exam  Constitutional: He is oriented to person, place, and time. He appears well-developed and well-nourished. No distress.  HENT:  Head: Normocephalic and atraumatic.  Eyes: Conjunctivae and EOM are normal.  Neck: Normal range of motion. Neck supple.  Cardiovascular: Normal rate, regular rhythm and normal heart sounds.   Pulmonary/Chest: Effort normal and breath sounds normal.  Musculoskeletal: Normal range of motion. He exhibits no edema.       Legs: Neurological: He is alert and oriented to person, place, and time.  Skin: Skin is warm and dry.  Psychiatric: He has a normal mood and affect. His behavior is normal.  Nursing note and vitals reviewed.   ED Course  Procedures  INCISION AND DRAINAGE Performed by: Kathrynn Running, PA student Consent: Verbal consent obtained. Risks and benefits: risks, benefits and alternatives were discussed Type: abscess  Body area: right leg  Anesthesia: local infiltration  Incision was made with a scalpel.  Local anesthetic: lidocaine 2% with epinephrine  Anesthetic total: 2 ml  Complexity: complex Blunt dissection to break up loculations  Drainage: purulent  Drainage amount: large  Packing material: none  Patient tolerance: Patient tolerated the procedure well with no immediate complications.   INCISION AND  DRAINAGE Performed by: Kathrynn Running, PA student Consent: Verbal consent obtained. Risks and benefits: risks, benefits and alternatives were discussed Type: abscess  Body area: left leg  Anesthesia: local infiltration  Incision was made with a scalpel.  Local anesthetic: lidocaine 2% with epinephrine  Anesthetic total: 2.5 ml  Complexity: complex Blunt dissection to break up loculations  Drainage: purulent  Drainage amount: large  Packing material: none  Patient tolerance: Patient tolerated the procedure well with no immediate complications.     DIAGNOSTIC STUDIES:  Oxygen Saturation is  98% on RA, normal by my interpretation.    COORDINATION OF CARE:  2:45 PM Discussed treatment plan with pt which includes I&D and pt agreed to plan.  Labs Review Labs Reviewed - No data to display  Imaging Review No results found.   EKG Interpretation None      MDM   Final diagnoses:  Abscess of left leg  Abscess of right leg    Patient in no apparent distress. Afebrile, vital signs stable. Areas examined at his last visit are now drainable. Both abscesses drained with purulent fluid. Given these have gotten worse, patient being on Bactrim, will switch patient to clindamycin. Follow-up with PCP. Stable for discharge. Return precautions given. Patient states understanding of treatment care plan and is agreeable.  I personally performed the services described in this documentation, which was scribed in my presence. The recorded information has been reviewed and is accurate.    Carman Ching, PA-C 11/09/14 [place], MD 11/09/14 [phone]

## 2014-11-09 NOTE — ED Notes (Signed)
Treated 3 days ago for infection in leg.  Pt states meds are not working.

## 2015-02-05 ENCOUNTER — Encounter (HOSPITAL_COMMUNITY): Payer: Self-pay

## 2015-02-05 ENCOUNTER — Emergency Department (HOSPITAL_COMMUNITY)
Admission: EM | Admit: 2015-02-05 | Discharge: 2015-02-05 | Disposition: A | Discharge status: Home / Self Care | Payer: Medicare Other | Attending: Emergency Medicine | Admitting: Emergency Medicine

## 2015-02-05 DIAGNOSIS — Z792 Long term (current) use of antibiotics: Secondary | ICD-10-CM | POA: Diagnosis not present

## 2015-02-05 DIAGNOSIS — M1990 Unspecified osteoarthritis, unspecified site: Secondary | ICD-10-CM | POA: Insufficient documentation

## 2015-02-05 DIAGNOSIS — Z79899 Other long term (current) drug therapy: Secondary | ICD-10-CM | POA: Diagnosis not present

## 2015-02-05 DIAGNOSIS — L02415 Cutaneous abscess of right lower limb: Secondary | ICD-10-CM | POA: Insufficient documentation

## 2015-02-05 DIAGNOSIS — Z720 Tobacco use: Secondary | ICD-10-CM | POA: Diagnosis not present

## 2015-02-05 DIAGNOSIS — L02416 Cutaneous abscess of left lower limb: Secondary | ICD-10-CM | POA: Diagnosis present

## 2015-02-05 DIAGNOSIS — L0291 Cutaneous abscess, unspecified: Secondary | ICD-10-CM

## 2015-02-05 DIAGNOSIS — Z8669 Personal history of other diseases of the nervous system and sense organs: Secondary | ICD-10-CM | POA: Diagnosis not present

## 2015-02-05 MED ORDER — CEPHALEXIN 500 MG PO CAPS: 500.0000 mg | ORAL_CAPSULE | Freq: Four times a day (QID) | ORAL | Status: DC

## 2015-02-05 NOTE — ED Provider Notes (Signed)
CSN: [id number]     Arrival date & time 02/05/15  1204 History  This chart was scribed for Glendell Docker, NP, working with Dorie Rank, MD by Starleen Arms, ED Scribe. This patient was seen in room [place] and the patient's care was started at 12:14 PM.   No chief complaint on file.  The history is provided by the patient. No language interpreter was used.   HPI Comments: Christian Elliott is a 59 y.o. male who presents to the Emergency Department complaining of intermittent areas of redness, pain, and itching for greater than 1 year on his bilateral lower extremities. His current episode onset within the past week and is above his right knee.   He reports he has been seen for this complaint before and was told to take benadryl and given one other unknown medication.  He reports the itching is worsened by water.  Patient denies fever.  NKA.    Past Medical History  Diagnosis Date  . Substance abuse   . Arthritis   . Carpal tunnel syndrome, left    Past Surgical History  Procedure Laterality Date  . Dental surgery     Family History  Problem Relation Age of Onset  . Diabetes Sister   . Diabetes Other    History  Substance Use Topics  . Smoking status: Current Every Day Smoker -- 1.00 packs/day    Types: Cigarettes  . Smokeless tobacco: Not on file  . Alcohol Use: Yes     Comment: occa    Review of Systems  Constitutional: Negative for fever.  Skin: Positive for color change.  All other systems reviewed and are negative.     Allergies  Review of patient's allergies indicates no known allergies.  Home Medications   Prior to Admission medications   Medication Sig Start Date End Date Taking? Authorizing Provider  clindamycin (CLEOCIN) 300 MG capsule Take 1 capsule (300 mg total) by mouth 4 (four) times daily. X 7 days 11/09/14   Carman Ching, PA-C  clotrimazole (LOTRIMIN) 1 % cream Apply 1 application topically 2 (two) times daily. 11/06/14   Courtney A Forcucci, PA-C   diphenhydrAMINE (BENADRYL) 25 MG tablet Take 1 tablet (25 mg total) by mouth every 6 (six) hours as needed for itching. 11/06/14   Courtney A Forcucci, PA-C  HYDROcodone-acetaminophen (NORCO/VICODIN) 5-325 MG per tablet Take 2 tablets by mouth every 4 (four) hours as needed. 08/14/14   Fransico Meadow, PA-C  pravastatin (PRAVACHOL) 20 MG tablet Take 20 mg by mouth daily.    Historical Provider, MD  sulfamethoxazole-trimethoprim (SEPTRA DS) 800-160 MG per tablet Take 1 tablet by mouth 2 (two) times daily. 11/06/14   Courtney A Forcucci, PA-C   There were no vitals taken for this visit. Physical Exam  Constitutional: He is oriented to person, place, and time. He appears well-developed and well-nourished. No distress.  HENT:  Head: Normocephalic and atraumatic.  Eyes: Conjunctivae and EOM are normal.  Neck: Neck supple. No tracheal deviation present.  Cardiovascular: Normal rate.   Pulmonary/Chest: Effort normal. No respiratory distress.  Musculoskeletal: Normal range of motion.  Neurological: He is alert and oriented to person, place, and time.  Skin:  Red warm are to the left shin and right knee. No fluctuance noted  Psychiatric: He has a normal mood and affect. His behavior is normal.  Nursing note and vitals reviewed.   ED Course  Procedures (including critical care time)  DIAGNOSTIC STUDIES: Oxygen Saturation is 97% on RA, normal  by my interpretation.    COORDINATION OF CARE:    Labs Review Labs Reviewed - No data to display  Imaging Review No results found.   EKG Interpretation None      MDM   Final diagnoses:  Abscess    Will treat with keflex. No definite abscess noted.  I personally performed the services described in this documentation, which was scribed in my presence. The recorded information has been reviewed and is accurate.    Glendell Docker, NP 02/05/15 [place]  Dorie Rank, MD 02/14/15 [phone]

## 2015-02-05 NOTE — Discharge Instructions (Signed)
Return as needed for any worsening redness or swelling Abscess An abscess is an infected area that contains a collection of pus and debris.It can occur in almost any part of the body. An abscess is also known as a furuncle or boil. CAUSES  An abscess occurs when tissue gets infected. This can occur from blockage of oil or sweat glands, infection of hair follicles, or a minor injury to the skin. As the body tries to fight the infection, pus collects in the area and creates pressure under the skin. This pressure causes pain. People with weakened immune systems have difficulty fighting infections and get certain abscesses more often.  SYMPTOMS Usually an abscess develops on the skin and becomes a painful mass that is red, warm, and tender. If the abscess forms under the skin, you may feel a moveable soft area under the skin. Some abscesses break open (rupture) on their own, but most will continue to get worse without care. The infection can spread deeper into the body and eventually into the bloodstream, causing you to feel ill.  DIAGNOSIS  Your caregiver will take your medical history and perform a physical exam. A sample of fluid may also be taken from the abscess to determine what is causing your infection. TREATMENT  Your caregiver may prescribe antibiotic medicines to fight the infection. However, taking antibiotics alone usually does not cure an abscess. Your caregiver may need to make a small cut (incision) in the abscess to drain the pus. In some cases, gauze is packed into the abscess to reduce pain and to continue draining the area. HOME CARE INSTRUCTIONS   Only take over-the-counter or prescription medicines for pain, discomfort, or fever as directed by your caregiver.  If you were prescribed antibiotics, take them as directed. Finish them even if you start to feel better.  If gauze is used, follow your caregiver's directions for changing the gauze.  To avoid spreading the  infection:  Keep your draining abscess covered with a bandage.  Wash your hands well.  Do not share personal care items, towels, or whirlpools with others.  Avoid skin contact with others.  Keep your skin and clothes clean around the abscess.  Keep all follow-up appointments as directed by your caregiver. SEEK MEDICAL CARE IF:   You have increased pain, swelling, redness, fluid drainage, or bleeding.  You have muscle aches, chills, or a general ill feeling.  You have a fever. MAKE SURE YOU:   Understand these instructions.  Will watch your condition.  Will get help right away if you are not doing well or get worse. Document Released: 09/19/2005 Document Revised: 06/10/2012 Document Reviewed: 02/22/2012 [facility] Patient Information 2015 [place], [place]. This information is not intended to replace advice given to you by your health care provider. Make sure you discuss any questions you have with your health care provider.

## 2015-02-05 NOTE — ED Notes (Signed)
Patient presents today with a chief complaint of multiple sores to bilateral lower extremities that have been present intermittently x 1 year with newer sores this week.

## 2015-03-07 ENCOUNTER — Encounter (HOSPITAL_COMMUNITY): Payer: Self-pay | Admitting: Emergency Medicine

## 2015-03-07 ENCOUNTER — Emergency Department (HOSPITAL_COMMUNITY)
Admission: EM | Admit: 2015-03-07 | Discharge: 2015-03-07 | Disposition: A | Discharge status: Home / Self Care | Payer: Medicare Other | Attending: Emergency Medicine | Admitting: Emergency Medicine

## 2015-03-07 DIAGNOSIS — Y998 Other external cause status: Secondary | ICD-10-CM | POA: Insufficient documentation

## 2015-03-07 DIAGNOSIS — Y9289 Other specified places as the place of occurrence of the external cause: Secondary | ICD-10-CM | POA: Insufficient documentation

## 2015-03-07 DIAGNOSIS — M1990 Unspecified osteoarthritis, unspecified site: Secondary | ICD-10-CM | POA: Insufficient documentation

## 2015-03-07 DIAGNOSIS — T23232A Burn of second degree of multiple left fingers (nail), not including thumb, initial encounter: Secondary | ICD-10-CM

## 2015-03-07 DIAGNOSIS — Z79899 Other long term (current) drug therapy: Secondary | ICD-10-CM | POA: Insufficient documentation

## 2015-03-07 DIAGNOSIS — Z8669 Personal history of other diseases of the nervous system and sense organs: Secondary | ICD-10-CM | POA: Diagnosis not present

## 2015-03-07 DIAGNOSIS — T23202A Burn of second degree of left hand, unspecified site, initial encounter: Secondary | ICD-10-CM

## 2015-03-07 DIAGNOSIS — X19XXXA Contact with other heat and hot substances, initial encounter: Secondary | ICD-10-CM | POA: Diagnosis not present

## 2015-03-07 DIAGNOSIS — Z792 Long term (current) use of antibiotics: Secondary | ICD-10-CM | POA: Diagnosis not present

## 2015-03-07 DIAGNOSIS — Z720 Tobacco use: Secondary | ICD-10-CM | POA: Diagnosis not present

## 2015-03-07 DIAGNOSIS — Y9389 Activity, other specified: Secondary | ICD-10-CM | POA: Insufficient documentation

## 2015-03-07 DIAGNOSIS — T23002A Burn of unspecified degree of left hand, unspecified site, initial encounter: Secondary | ICD-10-CM | POA: Diagnosis present

## 2015-03-07 MED ORDER — SILVER SULFADIAZINE 1 % EX CREA
TOPICAL_CREAM | Freq: Once | CUTANEOUS | Status: AC
  Administered 2015-03-07: 19:00:00 via TOPICAL
  Filled 2015-03-07: qty 50
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
## 2015-03-07 NOTE — Discharge Instructions (Signed)
Burn Care Your skin is a natural barrier to infection. It is the largest organ of your body. Burns damage this natural protection. To help prevent infection, it is very important to follow your caregiver's instructions in the care of your burn. Burns are classified as:  First degree. There is only redness of the skin (erythema). No scarring is expected.  Second degree. There is blistering of the skin. Scarring may occur with deeper burns.  Third degree. All layers of the skin are injured, and scarring is expected. HOME CARE INSTRUCTIONS   Wash your hands well before changing your bandage.  Change your bandage as often as directed by your caregiver.  Remove the old bandage. If the bandage sticks, you may soak it off with cool, clean water.  Cleanse the burn thoroughly but gently with mild soap and water.  Pat the area dry with a clean, dry cloth.  Apply a thin layer of antibacterial cream to the burn.  Apply a clean bandage as instructed by your caregiver.  Keep the bandage as clean and dry as possible.  Elevate the affected area for the first 24 hours, then as instructed by your caregiver.  Only take over-the-counter or prescription medicines for pain, discomfort, or fever as directed by your caregiver. SEEK IMMEDIATE MEDICAL CARE IF:   You develop excessive pain.  You develop redness, tenderness, swelling, or red streaks near the burn.  The burned area develops yellowish-white fluid (pus) or a bad smell.  You have a fever. MAKE SURE YOU:   Understand these instructions.  Will watch your condition.  Will get help right away if you are not doing well or get worse. Document Released: 12/10/2005 Document Revised: 03/03/2012 Document Reviewed: 05/02/2011 [employer] Patient Information 2015 [employer]. This information is not intended to replace advice given to you by your health care provider. Make sure you discuss any questions you have with your health care  provider.  
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
## 2016-02-10 ENCOUNTER — Emergency Department (HOSPITAL_COMMUNITY)
Admission: EM | Admit: 2016-02-10 | Discharge: 2016-02-10 | Disposition: A | Discharge status: Home / Self Care | Payer: Medicare HMO | Attending: Emergency Medicine | Admitting: Emergency Medicine

## 2016-02-10 ENCOUNTER — Encounter (HOSPITAL_COMMUNITY): Payer: Self-pay | Admitting: Emergency Medicine

## 2016-02-10 DIAGNOSIS — Y9289 Other specified places as the place of occurrence of the external cause: Secondary | ICD-10-CM | POA: Insufficient documentation

## 2016-02-10 DIAGNOSIS — Y998 Other external cause status: Secondary | ICD-10-CM | POA: Insufficient documentation

## 2016-02-10 DIAGNOSIS — Y9389 Activity, other specified: Secondary | ICD-10-CM | POA: Diagnosis not present

## 2016-02-10 DIAGNOSIS — F17210 Nicotine dependence, cigarettes, uncomplicated: Secondary | ICD-10-CM | POA: Diagnosis not present

## 2016-02-10 DIAGNOSIS — S0990XA Unspecified injury of head, initial encounter: Secondary | ICD-10-CM | POA: Diagnosis present

## 2016-02-10 DIAGNOSIS — W228XXA Striking against or struck by other objects, initial encounter: Secondary | ICD-10-CM | POA: Insufficient documentation

## 2016-02-10 NOTE — ED Notes (Signed)
Per pt, states he bumped his head on his bed-right head pain

## 2016-02-10 NOTE — ED Notes (Signed)
Registration reports pt left.

## 2016-02-10 NOTE — Progress Notes (Signed)
Entered in d/c instructions medicaid patients [place]: [address], [place] [address] [email or web address] Use this website to assist with understanding your coverage & to renew application As a Medicaid client you MUST contact DSS/SSI each time you change address, move to another [place] or another state to keep your address updated  [place] Medicaid Transportation to Dr appts if you are have full Medicaid: [phone], [phone]

## 2016-02-10 NOTE — ED Notes (Signed)
Called pt back to triage x1 with no response

## 2016-03-11 ENCOUNTER — Encounter (HOSPITAL_COMMUNITY): Payer: Self-pay | Admitting: Emergency Medicine

## 2016-03-11 ENCOUNTER — Emergency Department (HOSPITAL_COMMUNITY)
Admission: EM | Admit: 2016-03-11 | Discharge: 2016-03-11 | Disposition: A | Discharge status: Home / Self Care | Payer: Medicare HMO | Attending: Emergency Medicine | Admitting: Emergency Medicine

## 2016-03-11 ENCOUNTER — Emergency Department (HOSPITAL_COMMUNITY): Payer: Medicare HMO

## 2016-03-11 DIAGNOSIS — Z791 Long term (current) use of non-steroidal anti-inflammatories (NSAID): Secondary | ICD-10-CM | POA: Diagnosis not present

## 2016-03-11 DIAGNOSIS — M1990 Unspecified osteoarthritis, unspecified site: Secondary | ICD-10-CM | POA: Diagnosis not present

## 2016-03-11 DIAGNOSIS — Z79899 Other long term (current) drug therapy: Secondary | ICD-10-CM | POA: Insufficient documentation

## 2016-03-11 DIAGNOSIS — J3489 Other specified disorders of nose and nasal sinuses: Secondary | ICD-10-CM | POA: Diagnosis not present

## 2016-03-11 DIAGNOSIS — Z8669 Personal history of other diseases of the nervous system and sense organs: Secondary | ICD-10-CM | POA: Insufficient documentation

## 2016-03-11 DIAGNOSIS — R0981 Nasal congestion: Secondary | ICD-10-CM | POA: Diagnosis not present

## 2016-03-11 DIAGNOSIS — M791 Myalgia: Secondary | ICD-10-CM | POA: Diagnosis not present

## 2016-03-11 DIAGNOSIS — J029 Acute pharyngitis, unspecified: Secondary | ICD-10-CM | POA: Insufficient documentation

## 2016-03-11 DIAGNOSIS — R059 Cough, unspecified: Secondary | ICD-10-CM

## 2016-03-11 DIAGNOSIS — R51 Headache: Secondary | ICD-10-CM | POA: Diagnosis not present

## 2016-03-11 DIAGNOSIS — R05 Cough: Secondary | ICD-10-CM | POA: Diagnosis not present

## 2016-03-11 DIAGNOSIS — F17210 Nicotine dependence, cigarettes, uncomplicated: Secondary | ICD-10-CM | POA: Diagnosis not present

## 2016-03-11 MED ORDER — OXYMETAZOLINE HCL 0.05 % NA SOLN: 1.0000 | Freq: Two times a day (BID) | NASAL | Status: AC

## 2016-03-11 MED ORDER — ACETAMINOPHEN 325 MG PO TABS
650.0000 mg | ORAL_TABLET | Freq: Once | ORAL | Status: AC
  Administered 2016-03-11: 650 mg via ORAL
  Filled 2016-03-11: qty 2

## 2016-03-11 MED ORDER — BENZONATATE 100 MG PO CAPS: 200.0000 mg | ORAL_CAPSULE | Freq: Two times a day (BID) | ORAL | Status: DC | PRN

## 2016-03-11 NOTE — ED Notes (Addendum)
Pt c/o cough since Friday with cold chills, generalized aching, and nasal congestion. Did not take any antipyretics this morning. Denies CP, SOB. States OTC medications not helping.

## 2016-03-11 NOTE — ED Provider Notes (Signed)
CSN: [id number]     Arrival date & time 03/11/16  0704 History   First MD Initiated Contact with Patient 03/11/16 0804     Chief Complaint  Patient presents with  . Cough     (Consider location/radiation/quality/duration/timing/severity/associated sxs/prior Treatment) HPI   Patient is a 60 year old male with no pertinent past medical history who presents the ED with complaint of cough, onset 3 days. Patient reports having a productive cough with clear/yellow sputum. Endorses associated chills, body aches, nasal congestion, sinus pressure, sore throat, chest congestion and HA (which he states resolved this morning). He notes he has been taking ibuprofen and TheraFlu at home with mild intermittent relief. Pt denies fever, neck stiffness, visual changes, photophobia, chest pain, SOB, wheezing, abdominal pain, N/V/D, urinary symptoms, numbness, tingling, weakness. Patient denies any recent sick contacts. Denies smoking cigarettes.  Past Medical History  Diagnosis Date  . Substance abuse   . Arthritis   . Carpal tunnel syndrome, left    Past Surgical History  Procedure Laterality Date  . Dental surgery     Family History  Problem Relation Age of Onset  . Diabetes Sister   . Diabetes Other    Social History  Substance Use Topics  . Smoking status: Current Every Day Smoker -- 1.00 packs/day    Types: Cigarettes  . Smokeless tobacco: None  . Alcohol Use: Yes     Comment: occa    Review of Systems  Constitutional: Positive for chills.  HENT: Positive for congestion, sinus pressure and sore throat.   Respiratory: Positive for cough.   Musculoskeletal: Positive for myalgias (generalized body aches).  Neurological: Positive for headaches.  All other systems reviewed and are negative.     Allergies  Review of patient's allergies indicates no known allergies.  Home Medications   Prior to Admission medications   Medication Sig Start Date End Date Taking? Authorizing Provider   clotrimazole-betamethasone (LOTRISONE) cream Apply 1 application topically 2 (two) times daily as needed. Joint pain 02/24/16  Yes Historical Provider, MD  [facility] GAS RELIEF 125 MG CAPS Take 1 capsule by mouth daily as needed. gasa 02/24/16  Yes Historical Provider, MD  cyclobenzaprine (FLEXERIL) 10 MG tablet Take 10 mg by mouth daily. 02/24/16  Yes Historical Provider, MD  DUREZOL 0.05 % EMUL Place 1 drop into the left eye 4 (four) times daily. 02/21/16  Yes Historical Provider, MD  gabapentin (NEURONTIN) 300 MG capsule Take 300 mg by mouth at bedtime. 02/24/16  Yes Historical Provider, MD  ketorolac (ACULAR) 0.4 % SOLN Place 1 drop into the left eye 4 (four) times daily. 02/22/16  Yes Historical Provider, MD  levobunolol (BETAGAN) 0.5 % ophthalmic solution Place 1 drop into the left eye daily. 02/20/16  Yes Historical Provider, MD  meloxicam (MOBIC) 15 MG tablet Take 15 mg by mouth daily. 02/24/16  Yes Historical Provider, MD  pravastatin (PRAVACHOL) 20 MG tablet Take 20 mg by mouth daily.   Yes Historical Provider, MD  tamsulosin (FLOMAX) 0.4 MG CAPS capsule Take 0.4 mg by mouth daily. 02/25/16  Yes Historical Provider, MD   BP 136/82 mmHg  Pulse 88  Temp(Src) 97.4 F (36.3 C) (Oral)  Resp 18  SpO2 98% Physical Exam  Constitutional: He is oriented to person, place, and time. He appears well-developed and well-nourished. No distress.  HENT:  Head: Normocephalic and atraumatic.  Right Ear: Tympanic membrane normal.  Left Ear: Tympanic membrane normal.  Nose: Nose normal. Right sinus exhibits no maxillary sinus tenderness and no  frontal sinus tenderness. Left sinus exhibits no maxillary sinus tenderness and no frontal sinus tenderness.  Mouth/Throat: Uvula is midline, oropharynx is clear and moist and mucous membranes are normal. No oropharyngeal exudate, posterior oropharyngeal edema, posterior oropharyngeal erythema or tonsillar abscesses.  Eyes: Conjunctivae and EOM are normal. Right eye exhibits no  discharge. Left eye exhibits no discharge. No scleral icterus.  Neck: Normal range of motion. Neck supple.  Cardiovascular: Normal rate, regular rhythm, normal heart sounds and intact distal pulses.   Pulmonary/Chest: Effort normal and breath sounds normal. No respiratory distress. He has no wheezes. He has no rales. He exhibits no tenderness.  Abdominal: Soft. Bowel sounds are normal. He exhibits no distension and no mass. There is no tenderness. There is no rebound and no guarding.  Musculoskeletal: Normal range of motion. He exhibits no edema.  Lymphadenopathy:    He has no cervical adenopathy.  Neurological: He is alert and oriented to person, place, and time.  Skin: Skin is warm and dry. He is not diaphoretic.  Nursing note and vitals reviewed.   ED Course  Procedures (including critical care time) Labs Review Labs Reviewed - No data to display  Imaging Review Dg Chest 2 View  03/11/2016  CLINICAL DATA:  Shortness of breath with cough and congestion EXAM: CHEST  2 VIEW COMPARISON:  None. FINDINGS: Lungs are clear. Heart size and pulmonary vascularity are normal. No adenopathy. There is thoracolumbar levoscoliosis. IMPRESSION: No edema or consolidation. Electronically Signed   By: Lowella Grip III M.D.   On: 03/11/2016 08:56   I have personally reviewed and evaluated these images and lab results as part of my medical decision-making.   EKG Interpretation None      MDM   Final diagnoses:  Cough  Nasal congestion    Pt CXR negative for acute infiltrate. Patients symptoms are consistent with URI, likely viral etiology, versus flu. Discussed that antibiotics are not indicated for viral infections. Pt will be discharged with symptomatic treatment.  Verbalizes understanding and is agreeable with plan. Pt is hemodynamically stable & in NAD prior to dc.     Chesley Noon [place], [place] 03/11/16 [place], MD 03/11/16 1011

## 2016-03-11 NOTE — Discharge Instructions (Signed)
Take your medications as prescribed. Recommend taking Tylenol as prescribed over-the-counter as needed for fever and body aches. Drink at least six 8 ounce glasses of water daily to remain hydrated. Follow-up with your primary care provider in 3 days if your symptoms have not improved. Please return to the Emergency Department if symptoms worsen or new onset of fever, difficulty breathing, chest pain, coughing up blood, vomiting, unable to keep fluids down.

## 2016-03-11 NOTE — ED Notes (Signed)
Patient transported to X-ray 

## 2016-09-05 ENCOUNTER — Other Ambulatory Visit: Payer: Self-pay | Admitting: Orthopaedic Surgery

## 2016-09-05 DIAGNOSIS — M5410 Radiculopathy, site unspecified: Secondary | ICD-10-CM

## 2016-09-05 DIAGNOSIS — M542 Cervicalgia: Secondary | ICD-10-CM

## 2016-09-14 ENCOUNTER — Ambulatory Visit
Admission: RE | Admit: 2016-09-14 | Discharge: 2016-09-14 | Disposition: A | Discharge status: Home / Self Care | Payer: Medicare HMO | Source: Ambulatory Visit | Attending: Orthopaedic Surgery | Admitting: Orthopaedic Surgery

## 2016-09-14 DIAGNOSIS — M542 Cervicalgia: Secondary | ICD-10-CM

## 2016-09-14 DIAGNOSIS — M5410 Radiculopathy, site unspecified: Secondary | ICD-10-CM

## 2016-09-25 ENCOUNTER — Ambulatory Visit (INDEPENDENT_AMBULATORY_CARE_PROVIDER_SITE_OTHER): Payer: Medicare HMO | Admitting: Orthopaedic Surgery

## 2016-09-25 DIAGNOSIS — M5412 Radiculopathy, cervical region: Secondary | ICD-10-CM

## 2016-09-25 DIAGNOSIS — M542 Cervicalgia: Secondary | ICD-10-CM | POA: Diagnosis not present

## 2017-02-05 ENCOUNTER — Encounter: Payer: Self-pay | Admitting: Gastroenterology

## 2017-03-21 ENCOUNTER — Ambulatory Visit: Payer: Medicare HMO | Admitting: *Deleted

## 2017-03-21 VITALS — Ht 71.0 in | Wt 175.2 lb

## 2017-03-21 DIAGNOSIS — Z1211 Encounter for screening for malignant neoplasm of colon: Secondary | ICD-10-CM

## 2017-03-21 MED ORDER — NA SULFATE-K SULFATE-MG SULF 17.5-3.13-1.6 GM/177ML PO SOLN: ORAL | 0 refills | Status: DC

## 2017-03-21 NOTE — Progress Notes (Signed)
Pt denies allergies to eggs or soy products. Denies difficulty with sedation or anesthesia. Denies any diet or weight loss medications. Denies use of supplemental oxygen.  Emmi instructions refused by patient. 

## 2017-03-21 NOTE — Progress Notes (Signed)
Patient is a poor historian.   Patient spoke about one subject then the next.  Patient wa not sure about some of the medications on his list.  Will review again in admitting when he comes inn for his procedure.  Prep reviewed carefully and slowly for patient. Decent feedback noted.  Will call us if any questions.

## 2017-03-25 ENCOUNTER — Encounter: Payer: Self-pay | Admitting: Gastroenterology

## 2017-04-05 ENCOUNTER — Ambulatory Visit (AMBULATORY_SURGERY_CENTER): Payer: Medicare HMO | Admitting: Gastroenterology

## 2017-04-05 ENCOUNTER — Encounter: Payer: Self-pay | Admitting: Gastroenterology

## 2017-04-05 VITALS — BP 114/73 | HR 63 | Temp 98.4°F | Resp 13 | Ht 71.0 in | Wt 175.0 lb

## 2017-04-05 DIAGNOSIS — Z1211 Encounter for screening for malignant neoplasm of colon: Secondary | ICD-10-CM

## 2017-04-05 DIAGNOSIS — D127 Benign neoplasm of rectosigmoid junction: Secondary | ICD-10-CM

## 2017-04-05 DIAGNOSIS — Z1212 Encounter for screening for malignant neoplasm of rectum: Secondary | ICD-10-CM

## 2017-04-05 MED ORDER — SODIUM CHLORIDE 0.9 % IV SOLN: 500.0000 mL | INTRAVENOUS | Status: AC

## 2017-04-05 NOTE — Op Note (Signed)
[facility] Patient Name: Christian Elliott Procedure Date: 04/05/2017 8:36 AM MRN: [id number] Endoscopist: Mauri Pole , MD Age: 61 Referring MD:  Date of Birth: [birth year] Gender: Male Account #: [id number] Procedure:                Colonoscopy Indications:              Screening for colorectal malignant neoplasm, This                            is the patient's first colonoscopy Medicines:                Monitored Anesthesia Care Procedure:                Pre-Anesthesia Assessment:                           - Prior to the procedure, a History and Physical                            was performed, and patient medications and                            allergies were reviewed. The patient's tolerance of                            previous anesthesia was also reviewed. The risks                            and benefits of the procedure and the sedation                            options and risks were discussed with the patient.                            All questions were answered, and informed consent                            was obtained. Prior Anticoagulants: The patient has                            taken no previous anticoagulant or antiplatelet                            agents. ASA Grade Assessment: II - A patient with                            mild systemic disease. After reviewing the risks                            and benefits, the patient was deemed in                            satisfactory condition to undergo the procedure.  After obtaining informed consent, the colonoscope                            was passed under direct vision. Throughout the                            procedure, the patient's blood pressure, pulse, and                            oxygen saturations were monitored continuously. The                            Colonoscope was introduced through the anus and                            advanced to the the  cecum, identified by                            appendiceal orifice and ileocecal valve. The                            colonoscopy was performed without difficulty. The                            patient tolerated the procedure well. The quality                            of the bowel preparation was adequate. The                            ileocecal valve, appendiceal orifice, and rectum                            were photographed. Scope In: 8:42:07 AM Scope Out: 8:59:31 AM Total Procedure Duration: 0 hours 17 minutes 24 seconds  Findings:                 The perianal and digital rectal examinations were                            normal.                           A 5 mm polyp was found in the recto-sigmoid colon.                            The polyp was sessile. The polyp was removed with a                            cold snare. Resection and retrieval were complete.                           Non-bleeding internal hemorrhoids were found during  retroflexion. The hemorrhoids were large.                           The exam was otherwise without abnormality. Complications:            No immediate complications. Estimated Blood Loss:     Estimated blood loss was minimal. Impression:               - One 5 mm polyp at the recto-sigmoid colon,                            removed with a cold snare. Resected and retrieved.                           - Non-bleeding internal hemorrhoids.                           - The examination was otherwise normal. Recommendation:           - Patient has a contact number available for                            emergencies. The signs and symptoms of potential                            delayed complications were discussed with the                            patient. Return to normal activities tomorrow.                            Written discharge instructions were provided to the                            patient.                            - Resume previous diet.                           - Continue present medications.                           - Await pathology results.                           - Repeat colonoscopy in 5-10 years for surveillance                            based on pathology results. Mauri Pole, MD 04/05/2017 9:10:35 AM This report has been signed electronically.

## 2017-04-05 NOTE — Progress Notes (Signed)
Called to room to assist during endoscopic procedure.  Patient ID and intended procedure confirmed with present staff. Received instructions for my participation in the procedure from the performing physician.  

## 2017-04-05 NOTE — Progress Notes (Signed)
Report to PACU, RN, vss, BBS= Clear.  

## 2017-04-05 NOTE — Patient Instructions (Signed)
YOU HAD AN ENDOSCOPIC PROCEDURE TODAY AT THE [facility]:   Refer to the procedure report that was given to you for any specific questions about what was found during the examination.  If the procedure report does not answer your questions, please call your gastroenterologist to clarify.  If you requested that your care partner not be given the details of your procedure findings, then the procedure report has been included in a sealed envelope for you to review at your convenience later.  YOU SHOULD EXPECT: Some feelings of bloating in the abdomen. Passage of more gas than usual.  Walking can help get rid of the air that was put into your GI tract during the procedure and reduce the bloating. If you had a lower endoscopy (such as a colonoscopy or flexible sigmoidoscopy) you may notice spotting of blood in your stool or on the toilet paper. If you underwent a bowel prep for your procedure, you may not have a normal bowel movement for a few days.  Please Note:  You might notice some irritation and congestion in your nose or some drainage.  This is from the oxygen used during your procedure.  There is no need for concern and it should clear up in a day or so.  SYMPTOMS TO REPORT IMMEDIATELY:   Following lower endoscopy (colonoscopy or flexible sigmoidoscopy):  Excessive amounts of blood in the stool  Significant tenderness or worsening of abdominal pains  Swelling of the abdomen that is new, acute  Fever of 100F or higher  For urgent or emergent issues, a gastroenterologist can be reached at any hour by calling [phone].   DIET:  We do recommend a small meal at first, but then you may proceed to your regular diet.  Drink plenty of fluids but you should avoid alcoholic beverages for 24 hours.  ACTIVITY:  You should plan to take it easy for the rest of today and you should NOT DRIVE or use heavy machinery until tomorrow (because of the sedation medicines used during the test).     FOLLOW UP: Our staff will call the number listed on your records the next business day following your procedure to check on you and address any questions or concerns that you may have regarding the information given to you following your procedure. If we do not reach you, we will leave a message.  However, if you are feeling well and you are not experiencing any problems, there is no need to return our call.  We will assume that you have returned to your regular daily activities without incident.  If any biopsies were taken you will be contacted by phone or by letter within the next 1-3 weeks.  Please call us at [phone] if you have not heard about the biopsies in 3 weeks.   Await for biopsy results to determined next repeat Colonoscopy screening Polyps (handout given) Hemorrhoids (handout given)   SIGNATURES/CONFIDENTIALITY: You and/or your care partner have signed paperwork which will be entered into your electronic medical record.  These signatures attest to the fact that that the information above on your After Visit Summary has been reviewed and is understood.  Full responsibility of the confidentiality of this discharge information lies with you and/or your care-partner. 

## 2017-04-08 ENCOUNTER — Telehealth: Payer: Self-pay

## 2017-04-08 NOTE — Telephone Encounter (Signed)
  Follow up Call-  Call back number 04/05/2017  Post procedure Call Back phone  # [phone]  Permission to leave phone message Yes  Some recent data might be hidden     Patient questions:  Do you have a fever, pain , or abdominal swelling? No. Pain Score  0 *  Have you tolerated food without any problems? Yes.    Have you been able to return to your normal activities? Yes.    Do you have any questions about your discharge instructions: Diet   No. Medications  No. Follow up visit  No.  Do you have questions or concerns about your Care? No.  Actions: * If pain score is 4 or above: No action needed, pain <4.  No problems noted per pt. maw

## 2017-04-15 ENCOUNTER — Encounter: Payer: Self-pay | Admitting: Gastroenterology

## 2018-03-18 IMAGING — MR MR CERVICAL SPINE W/O CM
5 series · 44 of 48 positions shown · non-contrast
Comparison: Cervical spine radiographs 08/28/2016. No prior MRI for
comparison

CLINICAL DATA: Neck pain and cervical radiculopathy

EXAM:
MRI CERVICAL SPINE WITHOUT CONTRAST
TECHNIQUE: Multiplanar, multisequence MR imaging of the cervical spine was
performed. No intravenous contrast was administered.

[Series 12: T1 · sagittal · 3.0mm · 0.82mm/px · 6 of 15 slices shown]
[im 1/15]
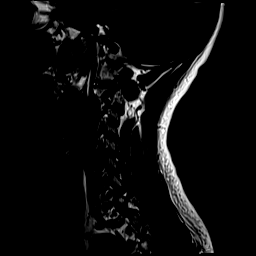
[im 3/15]
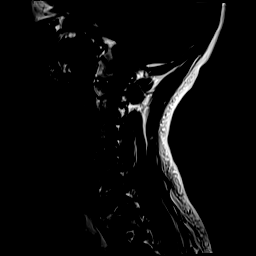
[im 6/15]
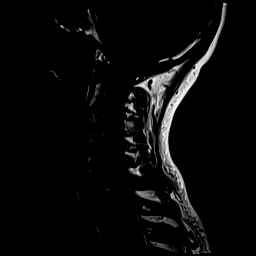
[im 9/15]
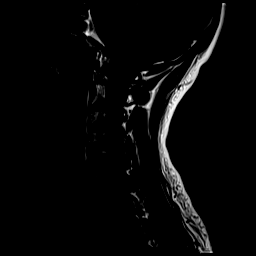
[im 12/15]
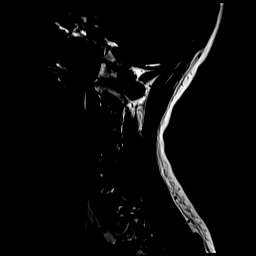
[im 15/15]
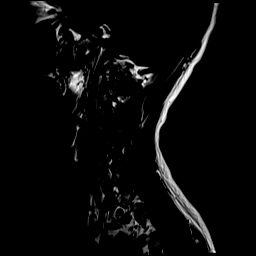

[Series 14: T2 · sagittal · 3.0mm · 0.66mm/px · 7 of 15 slices shown (1 of 2)]
[im 1/15]
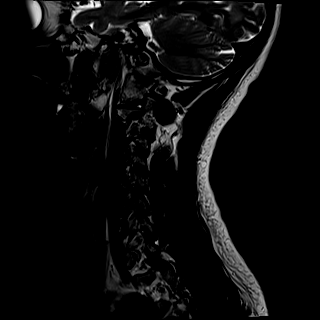
[im 3/15]
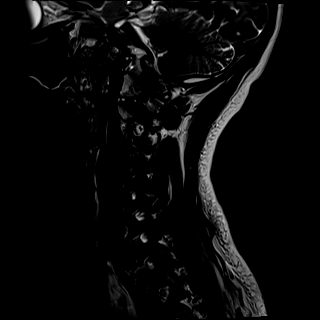
[im 5/15]
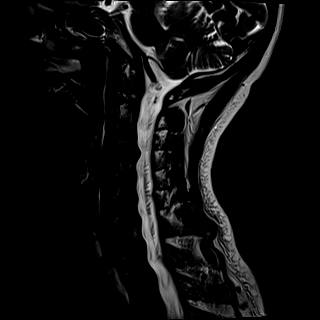
[im 8/15]
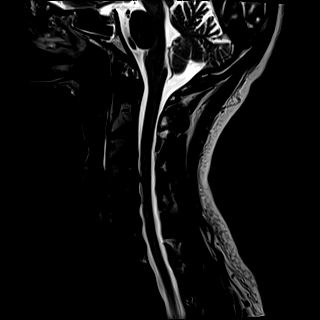
[im 10/15]
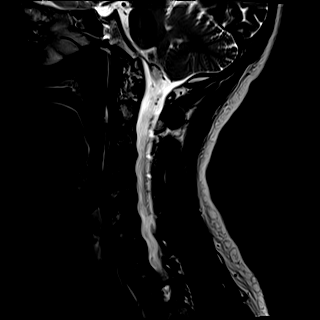
[im 12/15]
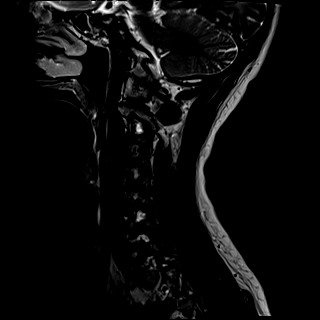
[im 15/15]
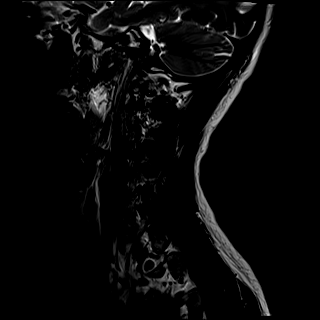

[Series 15: STIR · sagittal · 3.0mm · 0.41mm/px · 7 of 15 slices shown]
[im 1/15]
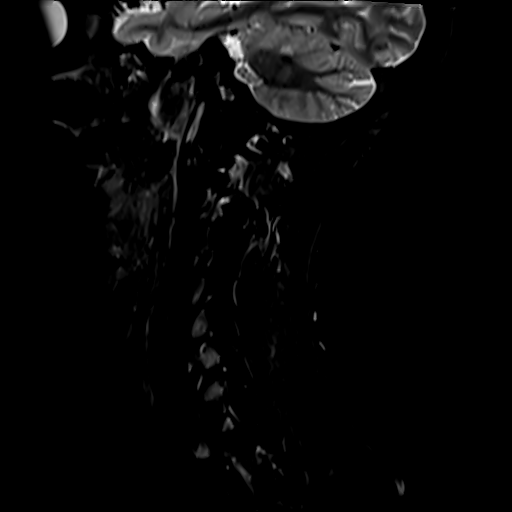
[im 3/15]
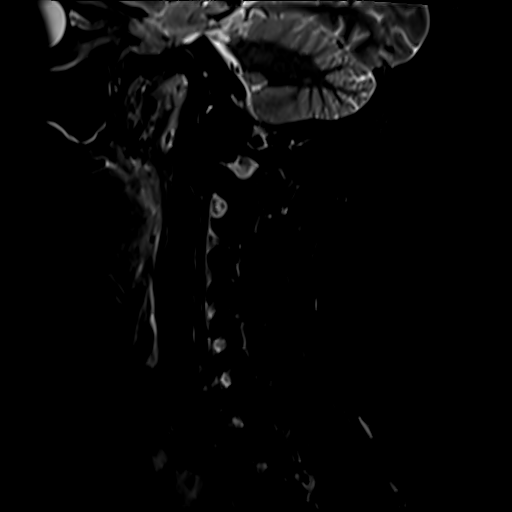
[im 5/15]
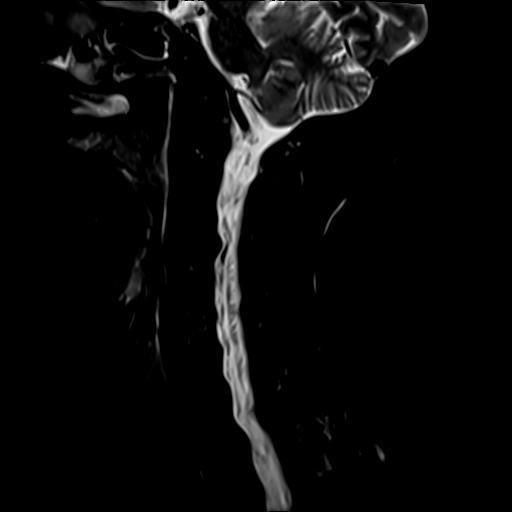
[im 8/15]
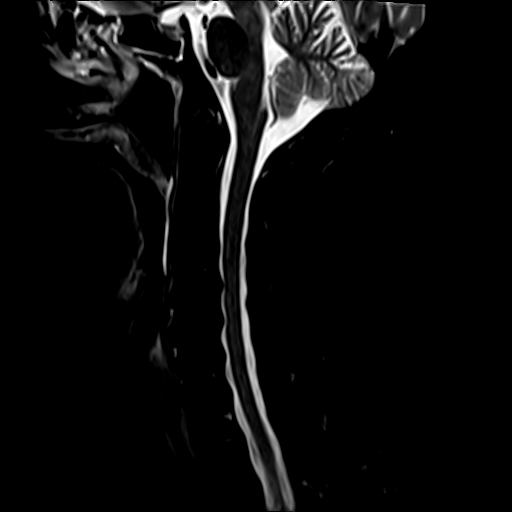
[im 10/15]
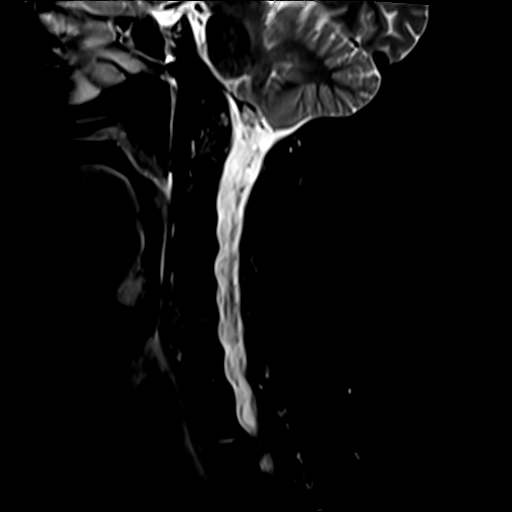
[im 12/15]
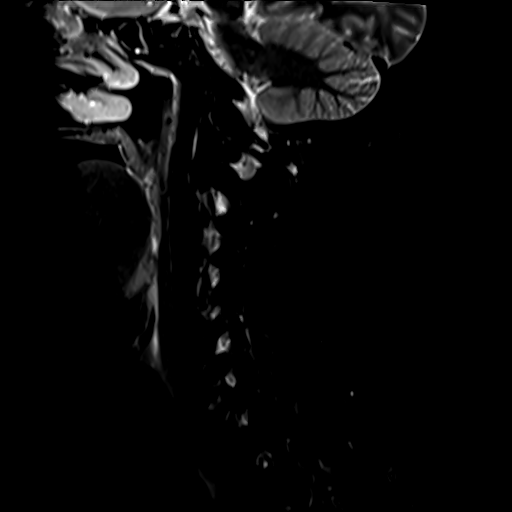
[im 15/15]
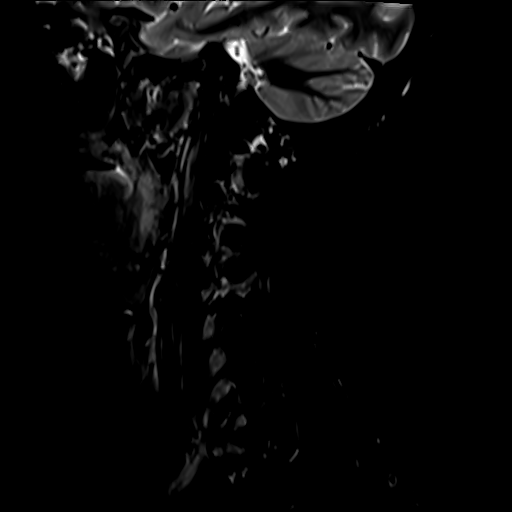

[Series 16: T2 · axial · 3.0mm · 0.50mm/px · z∈[-47,+46]mm · 14 of 30 slices shown (2 of 2)]
[im 1/30]
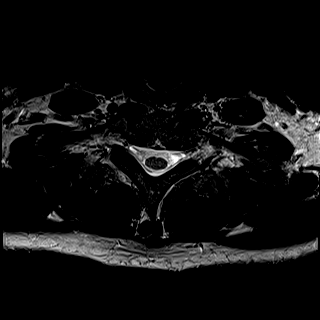
[im 3/30]
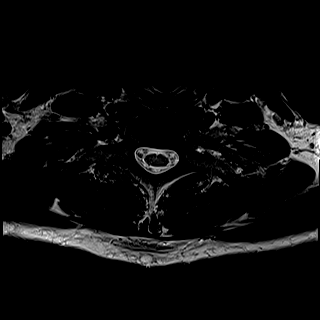
[im 5/30]
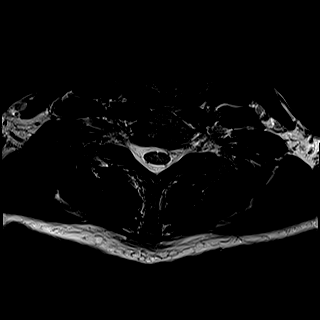
[im 7/30]
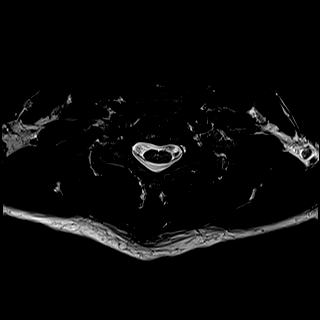
[im 9/30]
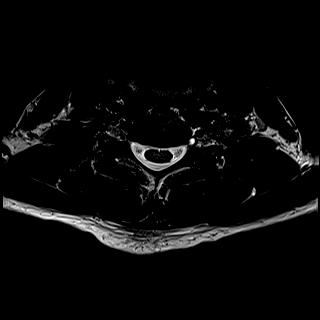
[im 12/30]
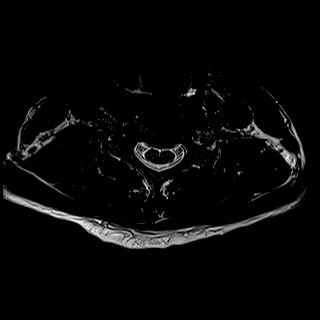
[im 14/30]
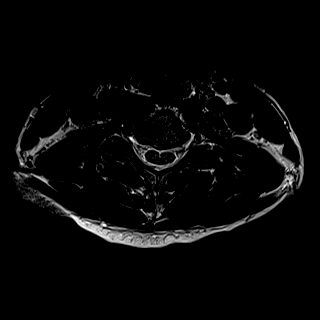
[im 16/30]
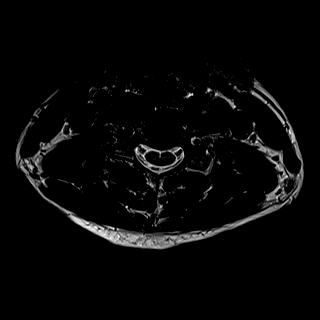
[im 18/30]
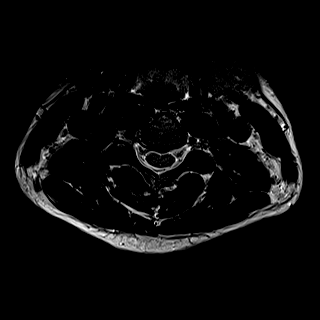
[im 21/30]
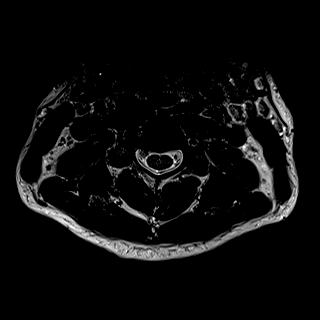
[im 23/30]
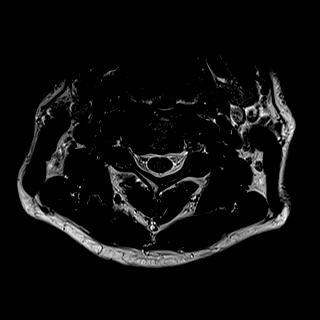
[im 25/30]
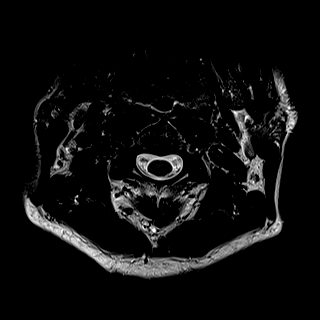
[im 27/30]
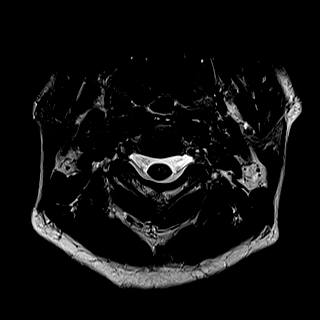
[im 30/30]
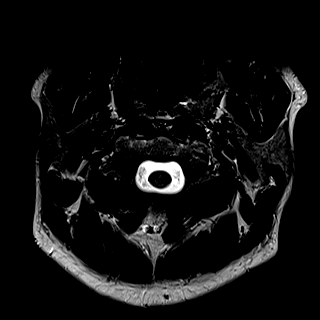

[Series 17: GRE · axial · 3.0mm · 0.83mm/px · z∈[-47,+46]mm · 10 of 30 slices shown]
[im 1/30]
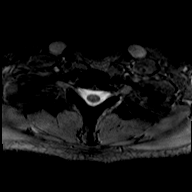
[im 3/30]
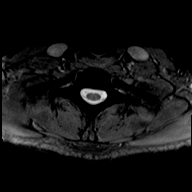
[im 5/30]
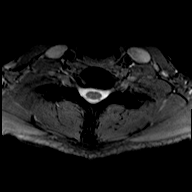
[im 7/30]
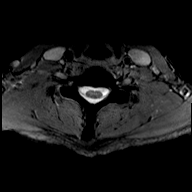
[im 9/30]
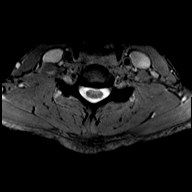
[im 14/30]
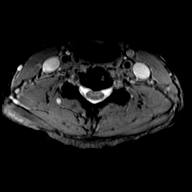
[im 16/30]
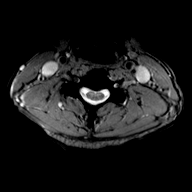
[im 21/30]
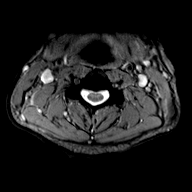
[im 25/30]
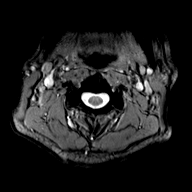
[im 30/30]
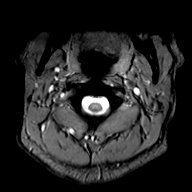

[44 of 48 positions shown; findings below may reference images not displayed]

FINDINGS: Alignment: Normal

Vertebrae: Negative for fracture or mass lesion. Normal bone marrow.

Cord: Normal spinal cord

Posterior Fossa, vertebral arteries, paraspinal tissues: Normal

Disc levels:

C2-3:  Negative

C3-4: Disc degeneration with disc space narrowing. Moderately large
right-sided uncinate spur causing right foraminal encroachment. No
cord deformity. Left foramen patent.

C4-5: Small central disc protrusion without cord deformity or
significant spinal or foraminal stenosis.

C5-6: Disc degeneration and spondylosis with mild central
osteophyte. No significant spinal stenosis or foraminal stenosis

C6-7:  Mild disc degeneration without spinal or foraminal stenosis

C7-T1:  Negative
IMPRESSION: Right foraminal encroachment C3-4 due to spurring

Small central disc protrusion C4-5

Mild spurring at C5-6 without significant spinal or foraminal
stenosis.
